# Patient Record
Sex: Female | Born: 1990
Health system: Southern US, Community
[De-identification: ages and names within clinical notes are randomized; demographics above are authoritative.]

## PROBLEM LIST (undated history)

## (undated) HISTORY — PX: TYMPANOSTOMY TUBE PLACEMENT: SHX32

---

## 2017-03-28 ENCOUNTER — Encounter: Payer: Self-pay | Admitting: Nurse Practitioner

## 2017-03-28 ENCOUNTER — Other Ambulatory Visit (INDEPENDENT_AMBULATORY_CARE_PROVIDER_SITE_OTHER): Payer: BLUE CROSS/BLUE SHIELD

## 2017-03-28 ENCOUNTER — Ambulatory Visit (INDEPENDENT_AMBULATORY_CARE_PROVIDER_SITE_OTHER): Payer: BLUE CROSS/BLUE SHIELD | Admitting: Nurse Practitioner

## 2017-03-28 ENCOUNTER — Ambulatory Visit (INDEPENDENT_AMBULATORY_CARE_PROVIDER_SITE_OTHER)
Admission: RE | Admit: 2017-03-28 | Discharge: 2017-03-28 | Disposition: A | Discharge status: Home / Self Care | Payer: BLUE CROSS/BLUE SHIELD | Source: Ambulatory Visit | Attending: Nurse Practitioner | Admitting: Nurse Practitioner

## 2017-03-28 VITALS — BP 130/92 | HR 59 | Temp 97.7°F | Ht 66.0 in | Wt 149.0 lb

## 2017-03-28 DIAGNOSIS — Z0000 Encounter for general adult medical examination without abnormal findings: Secondary | ICD-10-CM

## 2017-03-28 DIAGNOSIS — R590 Localized enlarged lymph nodes: Secondary | ICD-10-CM

## 2017-03-28 DIAGNOSIS — Z803 Family history of malignant neoplasm of breast: Secondary | ICD-10-CM | POA: Insufficient documentation

## 2017-03-28 DIAGNOSIS — R599 Enlarged lymph nodes, unspecified: Secondary | ICD-10-CM | POA: Diagnosis not present

## 2017-03-28 DIAGNOSIS — Z8349 Family history of other endocrine, nutritional and metabolic diseases: Secondary | ICD-10-CM | POA: Insufficient documentation

## 2017-03-28 DIAGNOSIS — Z800 Family history of malignant neoplasm of digestive organs: Secondary | ICD-10-CM | POA: Insufficient documentation

## 2017-03-28 DIAGNOSIS — Z111 Encounter for screening for respiratory tuberculosis: Secondary | ICD-10-CM

## 2017-03-28 LAB — CBC WITH DIFFERENTIAL/PLATELET
BASOS PCT: 1.7 % (ref 0.0–3.0)
Basophils Absolute: 0.1 10*3/uL (ref 0.0–0.1)
EOS PCT: 1.3 % (ref 0.0–5.0)
Eosinophils Absolute: 0.1 10*3/uL (ref 0.0–0.7)
HCT: 43 % (ref 36.0–46.0)
Hemoglobin: 14.5 g/dL (ref 12.0–15.0)
LYMPHS ABS: 2.2 10*3/uL (ref 0.7–4.0)
Lymphocytes Relative: 44.2 % (ref 12.0–46.0)
MCHC: 33.7 g/dL (ref 30.0–36.0)
MCV: 91.7 fl (ref 78.0–100.0)
MONOS PCT: 5.9 % (ref 3.0–12.0)
Monocytes Absolute: 0.3 10*3/uL (ref 0.1–1.0)
NEUTROS ABS: 2.3 10*3/uL (ref 1.4–7.7)
NEUTROS PCT: 46.9 % (ref 43.0–77.0)
PLATELETS: 276 10*3/uL (ref 150.0–400.0)
RBC: 4.68 Mil/uL (ref 3.87–5.11)
RDW: 13.6 % (ref 11.5–15.5)
WBC: 5 10*3/uL (ref 4.0–10.5)

## 2017-03-28 LAB — COMPREHENSIVE METABOLIC PANEL
ALT: 17 U/L (ref 0–35)
AST: 18 U/L (ref 0–37)
Albumin: 4.9 g/dL (ref 3.5–5.2)
Alkaline Phosphatase: 31 U/L — ABNORMAL LOW (ref 39–117)
BILIRUBIN TOTAL: 0.5 mg/dL (ref 0.2–1.2)
BUN: 17 mg/dL (ref 6–23)
CALCIUM: 9.9 mg/dL (ref 8.4–10.5)
CHLORIDE: 105 meq/L (ref 96–112)
CO2: 27 meq/L (ref 19–32)
Creatinine, Ser: 0.96 mg/dL (ref 0.40–1.20)
GFR: 74.89 mL/min (ref >60.00)
Glucose, Bld: 94 mg/dL (ref 70–99)
Potassium: 4.3 mEq/L (ref 3.5–5.1)
Sodium: 138 mEq/L (ref 135–145)
Total Protein: 7.8 g/dL (ref 6.0–8.3)

## 2017-03-28 LAB — TSH: TSH: 2.28 u[IU]/mL (ref 0.35–4.50)

## 2017-03-28 NOTE — Progress Notes (Signed)
Subjective:    Patient ID: Deirdre Peer, female    DOB: 1991/05/14, 26 y.o.   MRN: [id number]  Patient presents today for establish care (new patient).  HPI  Immunizations: (TDAP, Hep C screen, Pneumovax, Influenza, zoster)  Health Maintenance  Topic Date Due  . HIV Screening  08/10/2006  . Tetanus Vaccine  08/10/2010  . Pap Smear  08/10/2012  . Flu Shot  07/26/2017   Diet:healthy Weight:  Wt Readings from Last 3 Encounters:  03/28/17 149 lb (67.6 kg)    Exercise:walking and gym Fall Risk: Fall Risk  03/28/2017  Falls in the past year? No   Home Safety:home with female partner Depression/Suicide: Depression screen [facility] 2/9 03/28/2017  Decreased Interest 0  Down, Depressed, Hopeless 0  PHQ - 2 Score 0   No flowsheet data found. Pap Smear (every 59yr for >21-29 without HPV, every 56yrfor >30-6519yrith HPV):has GYN, irregular menstrual cycle,  Mammogram (yearly, >45y12yrot yet Sexual History (birth control, marital status, STD):sexually active with female  Medications and allergies reviewed with patient and updated if appropriate.  Patient Active Problem List   Diagnosis Date Noted  . LAD (lymphadenopathy), posterior cervical 03/28/2017  . Family history of breast cancer 03/28/2017  . FH: colon cancer in relative diagnosed at >50 y45rs old 03/28/2017  . FHx: early menopause 03/28/2017    No current outpatient prescriptions on file prior to visit.   No current facility-administered medications on file prior to visit.     History reviewed. No pertinent past medical history.  Past Surgical History:  Procedure Laterality Date  . TYMPANOSTOMY TUBE PLACEMENT Bilateral     Social History   Social History  . Marital status: Unknown    Spouse name: N/A  . Number of children: N/A  . Years of education: N/A   Social History Main Topics  . Smoking status: Never Smoker  . Smokeless tobacco: Never Used  . Alcohol use 1.2 oz/week    2 Cans of beer per week       Comment: a week  . Drug use: No  . Sexual activity: Yes   Other Topics Concern  . None   Social History Narrative  . None    Family History  Problem Relation Age of Onset  . Arthritis Mother   . Fibromyalgia Mother   . Early menopause Mother 39  23Cancer Father     prostate cancer  . Cancer Maternal Grandmother 45    breast cancer with brain Met  . Early menopause Maternal Grandmother 39  55Cancer Maternal Aunt 50    colon cancer        ROS  Objective:   Vitals:   03/28/17 1129  BP: (!) 130/92  Pulse: (!) 59  Temp: 97.7 F (36.5 C)    Body mass index is 24.05 kg/m.   Physical Examination:  Physical Exam  Constitutional: She is oriented to person, place, and time and well-developed, well-nourished, and in no distress. No distress.  HENT:  Right Ear: External ear normal.  Left Ear: External ear normal.  Nose: Nose normal.  Mouth/Throat: Oropharynx is clear and moist. No oropharyngeal exudate.  Eyes: Conjunctivae and EOM are normal. Pupils are equal, round, and reactive to light. No scleral icterus.  Neck: Normal range of motion. Neck supple. No thyroid mass and no thyromegaly present.    Cardiovascular: Normal rate, normal heart sounds and intact distal pulses.   Pulmonary/Chest: Effort normal and breath sounds normal. She exhibits no  tenderness.  Abdominal: Soft. Bowel sounds are normal. She exhibits no distension. There is no tenderness.  Musculoskeletal: Normal range of motion. She exhibits no edema or tenderness.  Lymphadenopathy:    She has cervical adenopathy.  Neurological: She is alert and oriented to person, place, and time. Gait normal.  Skin: Skin is warm and dry. No rash noted. No erythema.  Psychiatric: Affect and judgment normal.    ASSESSMENT and PLAN:  Kaelene was seen today for establish care.  Diagnoses and all orders for this visit:  Encounter for medical examination to establish care -     CBC w/Diff; Future -      TSH; Future -     HIV antibody; Future -     Comprehensive metabolic panel; Future -     DG Chest 2 View; Future  LAD (lymphadenopathy), posterior cervical -     CBC w/Diff; Future -     TSH; Future -     HIV antibody; Future -     Comprehensive metabolic panel; Future -     DG Chest 2 View; Future  Screening for tuberculosis -     PPD   No problem-specific Assessment & Plan notes found for this encounter.      Follow up: Return if symptoms worsen or fail to improve.  Wilfred Lacy, NP

## 2017-03-28 NOTE — Progress Notes (Signed)
Normal results, see office note

## 2017-03-28 NOTE — Progress Notes (Signed)
Pre visit review using our clinic review tool, if applicable. No additional management support is needed unless otherwise documented below in the visit note. 

## 2017-03-28 NOTE — Patient Instructions (Addendum)
Go to lab for blood draw and CXR.   Lymphangitis, Adult Lymphangitis is inflammation of one or more lymph vessels. This condition is usually caused by a bacterial infection. The lymphatic system is part of the body's defense system (immune system). It is a network of vessels, glands, and organs that carry fluid (lymph) and other substances around the body. Lymph vessels drain into glands called lymph nodes. These nodes filter bacteria and waste products from lymph. Lymphangitis usually develops when bacteria spreads to the lymphatic system from an infected wound or scrape on the skin of the arms or legs. It can also result from a skin infection (cellulitis) that spreads to the lymph nodes. Lymphangitis can spread quickly through your lymph system and into your blood (bacteremia). What are the causes? This condition is usually caused by bacteria that get into the lymph vessels. Most often, this is streptococcus bacteria. In some cases, staphylococcus bacteria may be the cause. Many other types of bacteria can also cause lymphangitis, but that is rare. What increases the risk? The following factors may make you more likely to develop this condition:  Being female. Men are more likely to get lymphangitis caused by cellulitis.  Having a decreased ability to fight infection or a weakened immune system.  Having diabetes.  Taking drugs that suppress the immune system.  Having chickenpox.  Being weak from another illness. What are the signs or symptoms? The most common symptom of lymphangitis is a wound or skin infection that develops red streaks in the skin. These are the infected lymph vessels. The red streaks will extend toward the lymph nodes that drain the vessels. Other symptoms may include:  Warmth and tenderness over the streaks.  Throbbing pain.  Swollen and tender lymph nodes.  For arm infections, these will be under the arm.  For leg infections, these will be in the groin  area.  Fever.  Chills.  Headache.  Appetite loss.  Muscle aches.  Fast pulse. How is this diagnosed? This condition may be diagnosed based on your symptoms and a physical exam. You may also have tests, such as:  Blood tests to check for an increase in white blood cells.  Blood cultures to look for bacteremia.  Culture and sensitivity testing. This is a test to find out what type of bacteria will grow from a sample of pus swabbed from the wound or skin infection. The results help determine which antibiotic medicines will kill the bacteria. How is this treated? Treatment for this condition may include:  Antibiotics.  You may be started on an antibiotic that is known to kill both streptococcus and staphylococcus bacteria.  Your antibiotics may need to be switched if tests show that your condition is caused by another type of bacteria.  If your infection is very bad or has spread to another area of your body, you may need to get antibiotics through an IV at the hospital.  Pain medicine.  Incision and drainage. This is a procedure that may be done at the hospital if pus needs to be drained from your wound. Follow these instructions at home:  Take over-the-counter and prescription medicines only as told by your health care provider.  Take your antibiotic medicine as told by your health care provider. Do not stop taking the antibiotic even if you start to feel better.  Rest at home until your health care provider says that you can return to your normal activities.  Follow instructions from your health care provider about how  to take care of your wound.  Raise (elevate) the affected area above the level of your heart while you are sitting or lying down.  Keep all follow-up visits as told by your health care provider. This is important. Contact a health care provider if:  You have chills or a fever.  Your symptoms do not go away with treatment.  Your symptoms come back  after treatment. This information is not intended to replace advice given to you by your health care provider. Make sure you discuss any questions you have with your health care provider. Document Released: 01/08/2016 Document Revised: 05/19/2016 Document Reviewed: 12/31/2014 Elsevier Interactive Patient Education  2017 [employer].

## 2017-03-29 ENCOUNTER — Telehealth: Payer: Self-pay | Admitting: Nurse Practitioner

## 2017-03-29 LAB — HIV ANTIBODY (ROUTINE TESTING W REFLEX): HIV 1&2 Ab, 4th Generation: NONREACTIVE

## 2017-03-29 NOTE — Telephone Encounter (Signed)
Pt states she missed a call but there are no notes please call back.

## 2017-03-29 NOTE — Telephone Encounter (Signed)
Left vm for pt to call back if she has any questions, but I havent call pt, still waiting for test result.

## 2017-03-29 NOTE — Progress Notes (Signed)
Normal results, see office note

## 2017-03-30 NOTE — Telephone Encounter (Signed)
Pt verbalize understand of test result.   

## 2017-03-31 LAB — TB SKIN TEST
INDURATION: 0 mm
TB SKIN TEST: NEGATIVE

## 2017-04-25 ENCOUNTER — Ambulatory Visit (INDEPENDENT_AMBULATORY_CARE_PROVIDER_SITE_OTHER): Payer: BLUE CROSS/BLUE SHIELD | Admitting: Nurse Practitioner

## 2017-04-25 VITALS — BP 110/78 | HR 70 | Temp 97.9°F | Ht 66.0 in | Wt 148.0 lb

## 2017-04-25 DIAGNOSIS — Z8349 Family history of other endocrine, nutritional and metabolic diseases: Secondary | ICD-10-CM

## 2017-04-25 DIAGNOSIS — G4700 Insomnia, unspecified: Secondary | ICD-10-CM | POA: Diagnosis not present

## 2017-04-25 DIAGNOSIS — N926 Irregular menstruation, unspecified: Secondary | ICD-10-CM | POA: Diagnosis not present

## 2017-04-25 DIAGNOSIS — J0140 Acute pansinusitis, unspecified: Secondary | ICD-10-CM | POA: Diagnosis not present

## 2017-04-25 DIAGNOSIS — R232 Flushing: Secondary | ICD-10-CM | POA: Diagnosis not present

## 2017-04-25 MED ORDER — SALINE SPRAY 0.65 % NA SOLN: 1.0000 | NASAL | 0 refills | Status: DC | PRN

## 2017-04-25 MED ORDER — FLUTICASONE PROPIONATE 50 MCG/ACT NA SUSP: 2.0000 | Freq: Every day | NASAL | 0 refills | Status: DC

## 2017-04-25 MED ORDER — AZITHROMYCIN 250 MG PO TABS: 250.0000 mg | ORAL_TABLET | Freq: Every day | ORAL | 0 refills | Status: DC

## 2017-04-25 MED ORDER — OXYMETAZOLINE HCL 0.05 % NA SOLN: 1.0000 | Freq: Two times a day (BID) | NASAL | 0 refills | Status: DC

## 2017-04-25 MED ORDER — METHYLPREDNISOLONE ACETATE 40 MG/ML IJ SUSP
40.0000 mg | Freq: Once | INTRAMUSCULAR | Status: AC
  Administered 2017-04-25: 40 mg via INTRAMUSCULAR

## 2017-04-25 MED ORDER — GUAIFENESIN ER 600 MG PO TB12: 600.0000 mg | ORAL_TABLET | Freq: Two times a day (BID) | ORAL | 0 refills | Status: DC | PRN

## 2017-04-25 MED FILL — FLUTICASONE PROP 50 MCG SPR: 50 | 30 days supply | Qty: 16 | Fill #0

## 2017-04-25 MED FILL — AZITHROMYCIN 250 MG TAB: 250 | 5 days supply | Qty: 6 | Fill #0

## 2017-04-25 NOTE — Patient Instructions (Signed)
[facility] Instructions: Flonase and Afrin use: apply 1spray of afrin in each nare, wait , then apply 2sprays of flonase in each nare. Use both nasal spray consecutively x 3days, then flonase only for at least 14days.  Encourage adequate oral hydration.  Use over-the-counter  "cold" medicines  such as "Tylenol cold" , "Advil cold",  "Mucinex" or" Mucinex D"  for cough and congestion.  Avoid decongestants if you have high blood pressure. Use" Delsym" or" Robitussin" cough syrup varietis for cough.  You can use plain "Tylenol" or "Advi"l for fever, chills and achyness.   "Common cold" symptoms are usually triggered by a virus.  The antibiotics are usually not necessary. On average, a" viral cold" illness would take 4-7 days to resolve. Please, make an appointment if you are not better or if you're worse.   You will be contacted to schedule ultrasound. Return to lab for blood draw in 2weeks.

## 2017-04-25 NOTE — Progress Notes (Signed)
Subjective:  Patient ID: Melanie Keller, female    DOB: February 09, 1991  Age: 26 y.o. MRN: [id number]  CC: Nasal Congestion (sore throat since friday, dayquil helps cant sleep at night. had bad epistaxis at noon went for ten minutes and finished with a clot and then stopped)   Sinusitis  This is a new problem. The current episode started in the past 7 days. The problem has been rapidly worsening since onset. There has been no fever. The pain is severe. Associated symptoms include chills, congestion, diaphoresis, ear pain, a hoarse voice, sinus pressure, a sore throat and swollen glands. Pertinent negatives include no coughing or shortness of breath. Past treatments include oral decongestants. The treatment provided mild relief.   Hot Flashes: She complains of persistent and worsening hot flashes associated with interrupted sleep and irregular menstrual cycle. Reports FH of premature ovarian failure (mother and aunt).  No outpatient prescriptions prior to visit.   No facility-administered medications prior to visit.     ROS See HPI  Objective:  BP 110/78 (BP Location: Left Arm, Patient Position: Sitting, Cuff Size: Normal)   Pulse 70   Temp 97.9 F (36.6 C) (Oral)   Ht  (1.676 m)   Wt 148 lb (67.1 kg)   LMP 03/26/2017 (Approximate)   SpO2 99%   BMI 23.89 kg/m   BP Readings from Last 3 Encounters:  04/25/17 110/78  03/28/17 (!) 130/92    Wt Readings from Last 3 Encounters:  04/25/17 148 lb (67.1 kg)  03/28/17 149 lb (67.6 kg)    Physical Exam  Constitutional: She is oriented to person, place, and time.  HENT:  Right Ear: Tympanic membrane, external ear and ear canal normal.  Left Ear: Tympanic membrane, external ear and ear canal normal.  Nose: Mucosal edema and rhinorrhea present. Right sinus exhibits maxillary sinus tenderness and frontal sinus tenderness. Left sinus exhibits maxillary sinus tenderness and frontal sinus tenderness.  Mouth/Throat: Uvula is  midline. No trismus in the jaw. Posterior oropharyngeal erythema present. No oropharyngeal exudate.  Eyes: No scleral icterus.  Neck: Normal range of motion. Neck supple. No thyromegaly present.  Cardiovascular: Normal rate and normal heart sounds.   Pulmonary/Chest: Effort normal and breath sounds normal.  Musculoskeletal: She exhibits no edema.  Lymphadenopathy:    She has cervical adenopathy.  Neurological: She is alert and oriented to person, place, and time.  Vitals reviewed.   Lab Results  Component Value Date   WBC 5.0 03/28/2017   HGB 14.5 03/28/2017   HCT 43.0 03/28/2017   PLT 276.0 03/28/2017   GLUCOSE 94 03/28/2017   ALT 17 03/28/2017   AST 18 03/28/2017   NA 138 03/28/2017   K 4.3 03/28/2017   CL 105 03/28/2017   CREATININE 0.96 03/28/2017   BUN 17 03/28/2017   CO2 27 03/28/2017   TSH 2.28 03/28/2017    Dg Chest 2 View  Result Date: 03/28/2017 CLINICAL DATA:  Posterior cervical lymphadenopathy EXAM: CHEST  2 VIEW COMPARISON:  None. FINDINGS: No active infiltrate or effusion is seen. Mediastinal and hilar contours are unremarkable. There is no evidence of adenopathy. The heart is within normal limits in size. No bony abnormality is seen. IMPRESSION: No active cardiopulmonary disease. Electronically Signed   By: Dwyane Dee M.D.   On: 03/28/2017 12:37    Assessment & Plan:   Azayla was seen today for nasal congestion.  Diagnoses and all orders for this visit:  Acute non-recurrent pansinusitis -     methylPREDNISolone  acetate (DEPO-MEDROL) injection 40 mg; Inject 1 mL (40 mg total) into the muscle once. -     sodium chloride (OCEAN) 0.65 % SOLN nasal spray; Place 1 spray into both nostrils as needed for congestion. -     guaiFENesin (MUCINEX) 600 MG 12 hr tablet; Take 1 tablet (600 mg total) by mouth 2 (two) times daily as needed for cough or to loosen phlegm. -     fluticasone (FLONASE) 50 MCG/ACT nasal spray; Place 2 sprays into both nostrils daily. -      oxymetazoline (AFRIN NASAL SPRAY) 0.05 % nasal spray; Place 1 spray into both nostrils 2 (two) times daily. Use only for 3days, then stop -     azithromycin (ZITHROMAX Z-PAK) 250 MG tablet; Take 1 tablet (250 mg total) by mouth daily. Take 2tabs on first day, then 1tab once a day till complete  Hot flashes -     FSH; Future -     Estradiol; Future -     Progesterone; Future -     PTH, intact and calcium; Future -     US Pelvis Complete; Future -     US Transvaginal Non-OB; Future  Insomnia, unspecified type -     FSH; Future -     Estradiol; Future -     Progesterone; Future -     PTH, intact and calcium; Future -     US Pelvis Complete; Future -     US Transvaginal Non-OB; Future  FHx: early menopause -     FSH; Future -     Estradiol; Future -     Progesterone; Future -     PTH, intact and calcium; Future -     US Pelvis Complete; Future -     US Transvaginal Non-OB; Future  Irregular menstrual cycle -     FSH; Future -     Estradiol; Future -     Progesterone; Future -     PTH, intact and calcium; Future -     US Pelvis Complete; Future -     US Transvaginal Non-OB; Future   I am having Melanie Keller start on sodium chloride, guaiFENesin, fluticasone, oxymetazoline, and azithromycin. We administered methylPREDNISolone acetate.  Meds ordered this encounter  Medications  . methylPREDNISolone acetate (DEPO-MEDROL) injection 40 mg  . sodium chloride (OCEAN) 0.65 % SOLN nasal spray    Sig: Place 1 spray into both nostrils as needed for congestion.    Dispense:  15 mL    Refill:  0    Order Specific Question:   Supervising Provider    Answer:   Tresa Garter [[id number]]  . guaiFENesin (MUCINEX) 600 MG 12 hr tablet    Sig: Take 1 tablet (600 mg total) by mouth 2 (two) times daily as needed for cough or to loosen phlegm.    Dispense:  14 tablet    Refill:  0    Order Specific Question:   Supervising Provider    Answer:   Tresa Garter [[id number]]  . fluticasone  (FLONASE) 50 MCG/ACT nasal spray    Sig: Place 2 sprays into both nostrils daily.    Dispense:  16 g    Refill:  0    Order Specific Question:   Supervising Provider    Answer:   Tresa Garter [[id number]]  . oxymetazoline (AFRIN NASAL SPRAY) 0.05 % nasal spray    Sig: Place 1 spray into both nostrils 2 (two) times daily. Use only for 3days,  then stop    Dispense:  30 mL    Refill:  0    Order Specific Question:   Supervising Provider    Answer:   Tresa Garter [[id number]]  . azithromycin (ZITHROMAX Z-PAK) 250 MG tablet    Sig: Take 1 tablet (250 mg total) by mouth daily. Take 2tabs on first day, then 1tab once a day till complete    Dispense:  6 tablet    Refill:  0    Order Specific Question:   Supervising Provider    Answer:   Tresa Garter [[id number]]    Follow-up: Return if symptoms worsen or fail to improve.  Alysia Penna, NP

## 2017-04-25 NOTE — Progress Notes (Signed)
Pre visit review using our clinic review tool, if applicable. No additional management support is needed unless otherwise documented below in the visit note. 

## 2017-05-12 ENCOUNTER — Ambulatory Visit
Admission: RE | Admit: 2017-05-12 | Discharge: 2017-05-12 | Disposition: A | Discharge status: Home / Self Care | Payer: BLUE CROSS/BLUE SHIELD | Source: Ambulatory Visit | Attending: Nurse Practitioner | Admitting: Nurse Practitioner

## 2017-05-12 DIAGNOSIS — R188 Other ascites: Secondary | ICD-10-CM | POA: Diagnosis not present

## 2017-05-12 DIAGNOSIS — Z8349 Family history of other endocrine, nutritional and metabolic diseases: Secondary | ICD-10-CM

## 2017-05-12 DIAGNOSIS — R232 Flushing: Secondary | ICD-10-CM

## 2017-05-12 DIAGNOSIS — G4700 Insomnia, unspecified: Secondary | ICD-10-CM

## 2017-05-12 DIAGNOSIS — N926 Irregular menstruation, unspecified: Secondary | ICD-10-CM

## 2017-05-15 ENCOUNTER — Other Ambulatory Visit (INDEPENDENT_AMBULATORY_CARE_PROVIDER_SITE_OTHER): Payer: BLUE CROSS/BLUE SHIELD

## 2017-05-15 DIAGNOSIS — N926 Irregular menstruation, unspecified: Secondary | ICD-10-CM | POA: Diagnosis not present

## 2017-05-15 DIAGNOSIS — Z8349 Family history of other endocrine, nutritional and metabolic diseases: Secondary | ICD-10-CM | POA: Diagnosis not present

## 2017-05-15 DIAGNOSIS — R232 Flushing: Secondary | ICD-10-CM | POA: Diagnosis not present

## 2017-05-15 DIAGNOSIS — G4700 Insomnia, unspecified: Secondary | ICD-10-CM | POA: Diagnosis not present

## 2017-05-15 LAB — FOLLICLE STIMULATING HORMONE: FSH: 122.9 m[IU]/mL

## 2017-05-16 ENCOUNTER — Encounter: Payer: Self-pay | Admitting: Nurse Practitioner

## 2017-05-16 LAB — PROGESTERONE: Progesterone: <0.5 ng/mL

## 2017-05-16 LAB — PTH, INTACT AND CALCIUM
CALCIUM: 9.8 mg/dL (ref 8.6–10.2)
PTH: 39 pg/mL (ref 14–64)

## 2017-05-16 LAB — ESTRADIOL: Estradiol: 18 pg/mL

## 2017-05-16 NOTE — Telephone Encounter (Signed)
Apologized to patient for conflicting result note. Informed patient about result note correction. patient states she is not concerned about fertility at this time.  She will like referral to endocrinology for possible estrogen and progesterone replacement due to risk of osteoporosis and cardiovascular disease. I advised patient to inform endocrinology about FH of breast and colon cancer at <50. She verbalized understanding.

## 2017-05-26 ENCOUNTER — Encounter: Payer: Self-pay | Admitting: Nurse Practitioner

## 2017-05-26 DIAGNOSIS — E288 Other ovarian dysfunction: Principal | ICD-10-CM

## 2017-05-26 DIAGNOSIS — E2839 Other primary ovarian failure: Secondary | ICD-10-CM

## 2017-07-13 ENCOUNTER — Encounter: Payer: Self-pay | Admitting: Endocrinology

## 2017-07-13 ENCOUNTER — Other Ambulatory Visit: Payer: Self-pay | Admitting: Nurse Practitioner

## 2017-07-13 ENCOUNTER — Ambulatory Visit: Payer: BLUE CROSS/BLUE SHIELD | Admitting: Endocrinology

## 2017-07-13 ENCOUNTER — Encounter: Payer: Self-pay | Admitting: Nurse Practitioner

## 2017-07-13 DIAGNOSIS — N911 Secondary amenorrhea: Secondary | ICD-10-CM

## 2017-07-13 DIAGNOSIS — E288 Other ovarian dysfunction: Principal | ICD-10-CM

## 2017-07-13 DIAGNOSIS — E2839 Other primary ovarian failure: Secondary | ICD-10-CM

## 2017-07-13 MED ORDER — NORGESTIM-ETH ESTRAD TRIPHASIC 0.18/0.215/0.25 MG-35 MCG PO TABS: 1.0000 | ORAL_TABLET | Freq: Every day | ORAL | 11 refills | Status: DC

## 2017-07-13 NOTE — Patient Instructions (Signed)
I have sent a prescription to your pharmacy, for birth control pills. We can check the vitamin B-12 the next time you are having blood tests taken. Please return in 1 year.

## 2017-07-13 NOTE — Progress Notes (Signed)
Subjective:    Patient ID: Melanie Keller, female    DOB: Jan 10, 1991, 26 y.o.   MRN: [id number]  HPI Pt is ref by Wilfred Lacy, NP, for POF.  She reports menarche at age 81. She is G0.  Menses became less frequent a few years ago, then stopped 1 year ago.  She has slight dryness of the vagina, and assoc intermitt spotting.   No past medical history on file.  Past Surgical History:  Procedure Laterality Date  . TYMPANOSTOMY TUBE PLACEMENT Bilateral     Social History   Social History  . Marital status: Unknown    Spouse name: N/A  . Number of children: N/A  . Years of education: N/A   Occupational History  . Not on file.   Social History Main Topics  . Smoking status: Never Smoker  . Smokeless tobacco: Never Used  . Alcohol use 1.2 oz/week    2 Cans of beer per week     Comment: a week  . Drug use: No  . Sexual activity: Yes   Other Topics Concern  . Not on file   Social History Narrative  . No narrative on file    Current Outpatient Prescriptions on File Prior to Visit  Medication Sig Dispense Refill  . fluticasone (FLONASE) 50 MCG/ACT nasal spray Place 2 sprays into both nostrils daily. (Patient not taking: Reported on 07/13/2017) 16 g 0  . guaiFENesin (MUCINEX) 600 MG 12 hr tablet Take 1 tablet (600 mg total) by mouth 2 (two) times daily as needed for cough or to loosen phlegm. (Patient not taking: Reported on 07/13/2017) 14 tablet 0  . oxymetazoline (AFRIN NASAL SPRAY) 0.05 % nasal spray Place 1 spray into both nostrils 2 (two) times daily. Use only for 3days, then stop (Patient not taking: Reported on 07/13/2017) 30 mL 0  . sodium chloride (OCEAN) 0.65 % SOLN nasal spray Place 1 spray into both nostrils as needed for congestion. (Patient not taking: Reported on 07/13/2017) 15 mL 0   No current facility-administered medications on file prior to visit.     No Known Allergies  Family History  Problem Relation Age of Onset  . Arthritis Mother   . Fibromyalgia  Mother   . Early menopause Mother 47  . Cancer Father        prostate cancer  . Cancer Maternal Grandmother 45       breast cancer with brain Met  . Early menopause Maternal Grandmother 71  . Cancer Maternal Aunt 50       colon cancer    BP 116/76   Pulse (!) 54   Wt 148 lb 3.2 oz (67.2 kg)   SpO2 98%   BMI 23.92 kg/m     Review of Systems  Constitutional:       She has lost af ew lbs, due to her efforts  HENT: Negative for hearing loss.   Eyes: Negative for visual disturbance.  Respiratory: Negative for shortness of breath.   Cardiovascular: Negative for chest pain.  Gastrointestinal: Negative for nausea.  Endocrine: Negative for cold intolerance.  Genitourinary: Negative for hematuria.  Musculoskeletal: Positive for arthralgias.  Skin: Negative for rash.  Allergic/Immunologic: Negative for environmental allergies.  Neurological: Negative for numbness.  Hematological: Bruises/bleeds easily.  Psychiatric/Behavioral: Negative for dysphoric mood.   "hot flashes" have resolved.  Denies diarrhea    Objective:   Physical Exam VS: see vs page GEN: no distress HEAD: head: no deformity eyes: no periorbital swelling, no  proptosis external nose and ears are normal mouth: no lesion seen NECK: supple, thyroid is not enlarged CHEST WALL: no deformity LUNGS:  Clear to auscultation CV: reg rate and rhythm, no murmur ABD: abdomen is soft, nontender.  no hepatosplenomegaly.  not distended.  no hernia MUSCULOSKELETAL: muscle bulk and strength are grossly normal.  no obvious joint swelling.  gait is normal and steady EXTEMITIES: no edema PULSES: no carotid bruit NEURO:  cn 2-12 grossly intact.   readily moves all 4's.  sensation is intact to touch on all 4's SKIN:  Normal texture and temperature.  No rash or suspicious lesion is visible.  No terminal hair on the face.  No vitiligo.   NODES:  None palpable at the neck PSYCH: alert, well-oriented.  Does not appear anxious nor  depressed.  Lab Results  Component Value Date   CREATININE 0.96 03/28/2017   BUN 17 03/28/2017   NA 138 03/28/2017   K 4.3 03/28/2017   CL 105 03/28/2017   CO2 27 03/28/2017   Lab Results  Component Value Date   TSH 2.28 03/28/2017   Lab Results  Component Value Date   PTH 39 05/15/2017   CALCIUM 9.8 05/15/2017   Lab Results  Component Value Date   WBC 5.0 03/28/2017   HGB 14.5 03/28/2017   HCT 43.0 03/28/2017   MCV 91.7 03/28/2017   PLT 276.0 03/28/2017   FSH=123.   I have reviewed outside records, and summarized: Pt was noted to have secondary amenorrhea, and referred here.  She was seen for sinusitis, and was noted to have amenorrhea and hot flashes.     Assessment & Plan:  Premature ovarian failure, new.  No clinical evidence for other autoimmune disorders.    Patient Instructions  I have sent a prescription to your pharmacy, for birth control pills. We can check the vitamin B-12 the next time you are having blood tests taken. Please return in 1 year.

## 2017-07-15 ENCOUNTER — Encounter: Payer: Self-pay | Admitting: Endocrinology

## 2017-07-15 DIAGNOSIS — N911 Secondary amenorrhea: Secondary | ICD-10-CM | POA: Insufficient documentation

## 2017-08-31 IMAGING — US US PELVIS COMPLETE
1 series · 14 of 25 positions shown · non-contrast
Comparison: None

CLINICAL DATA: Initial evaluation for irregular menstrual cycles,
hot flashes, insomnia. Family history of premature ovarian failure.

EXAM:
TRANSABDOMINAL AND TRANSVAGINAL ULTRASOUND OF PELVIS
TECHNIQUE: Both transabdominal and transvaginal ultrasound examinations of the
pelvis were performed. Transabdominal technique was performed for
global imaging of the pelvis including uterus, ovaries, adnexal
regions, and pelvic cul-de-sac. It was necessary to proceed with
endovaginal exam following the transabdominal exam to visualize the
uterus and ovaries.

[Series 1: us pelvis complete · 0.19mm/px · 14 of 57 slices shown]
[im 1/57]
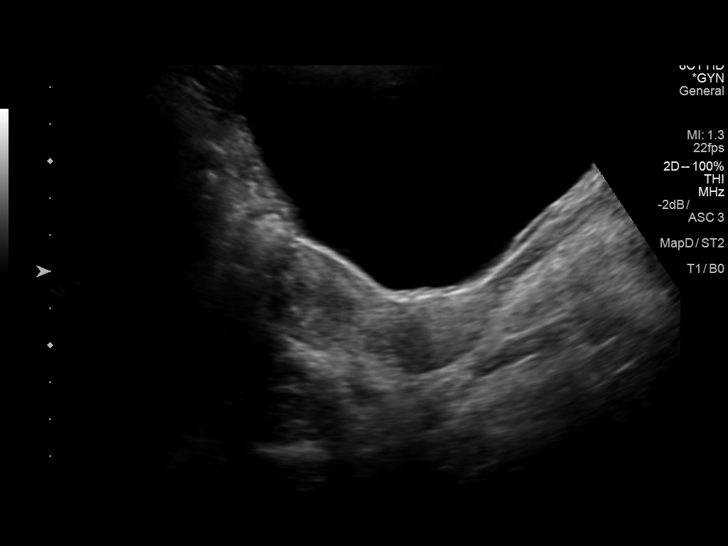
[im 5/57]
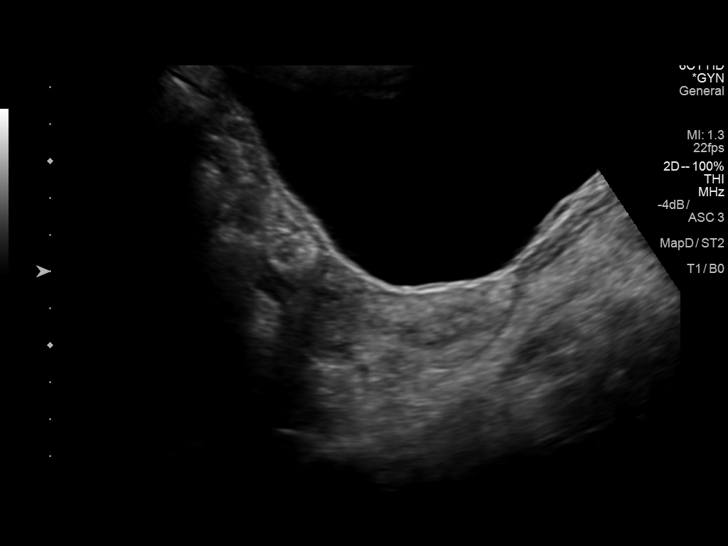
[im 10/57]
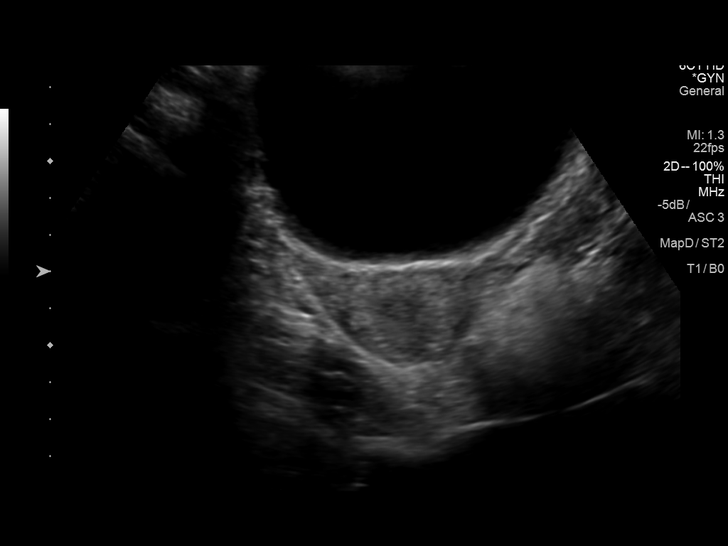
[im 15/57]
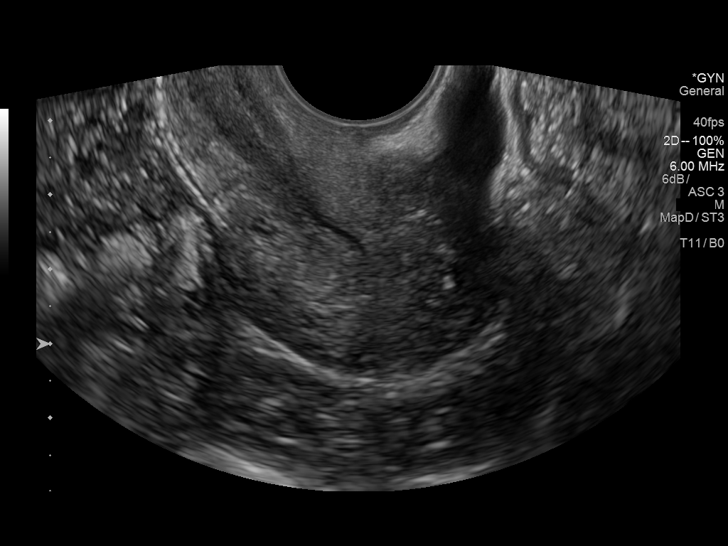
[im 19/57]
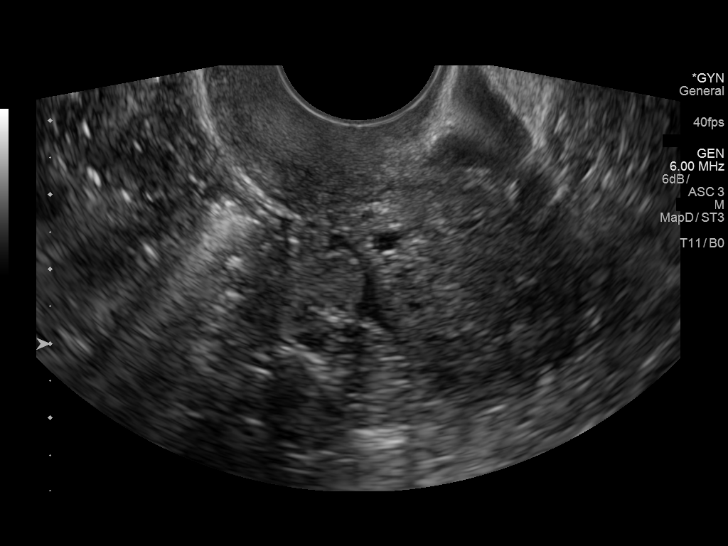
[im 22/57]
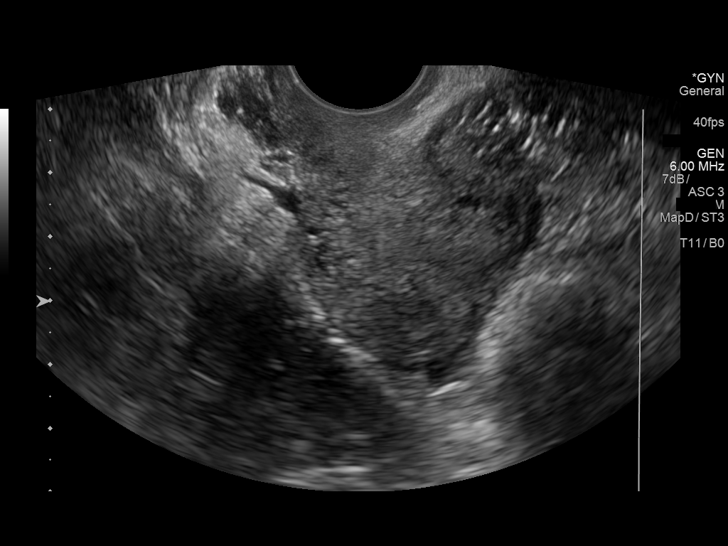
[im 26/57]
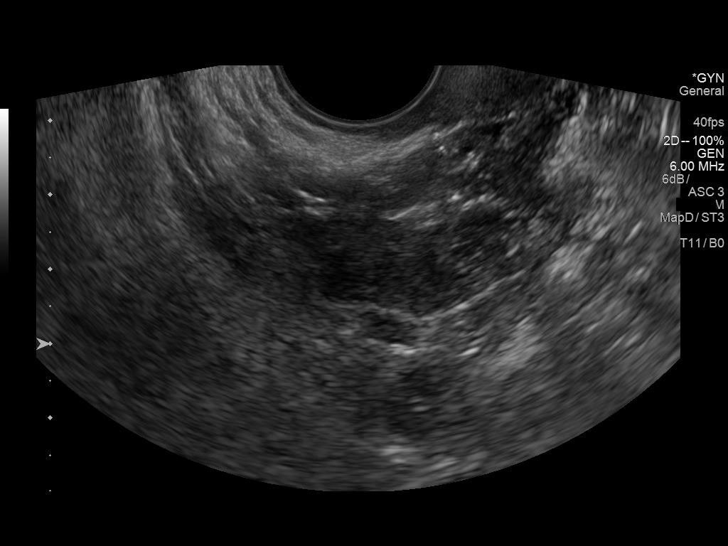
[im 31/57]
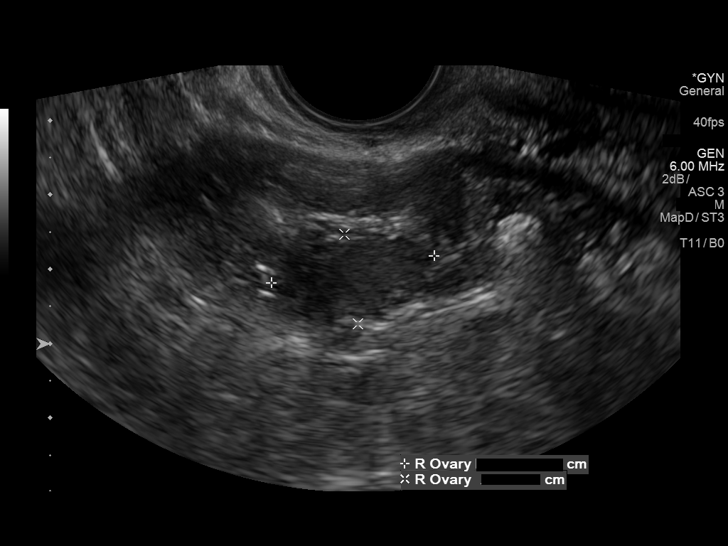
[im 36/57]
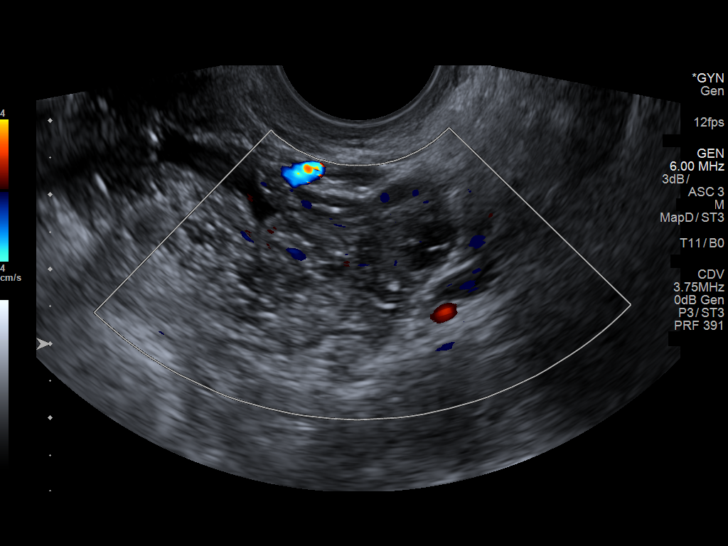
[im 38/57]
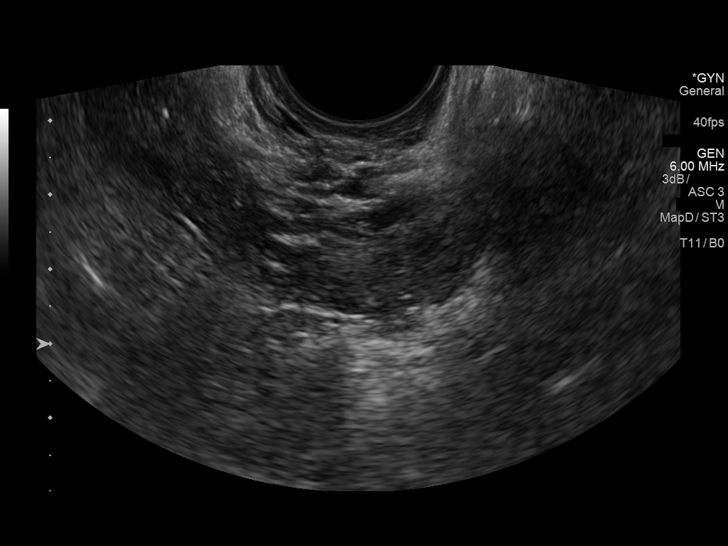
[im 43/57]
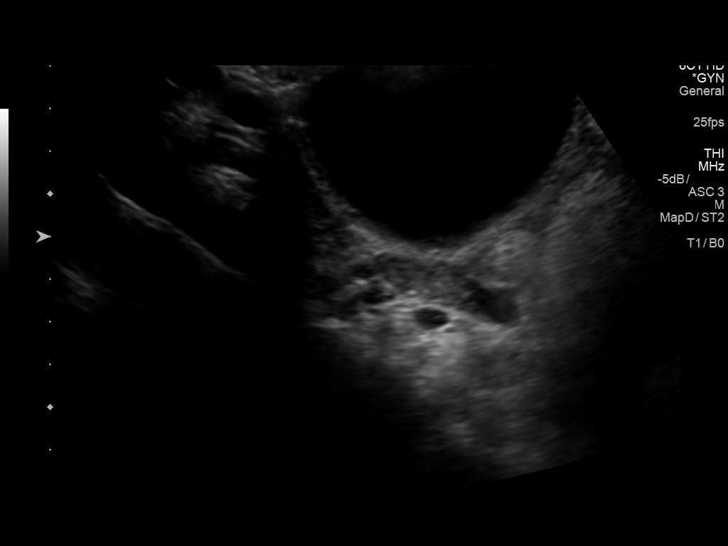
[im 47/57]
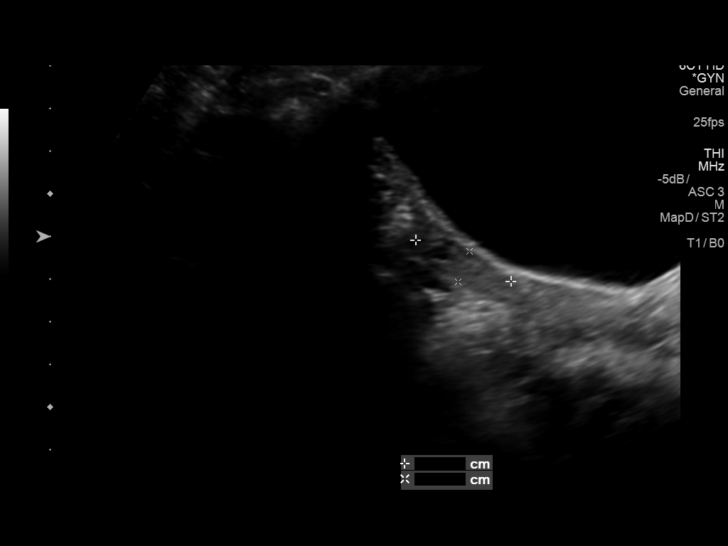
[im 52/57]
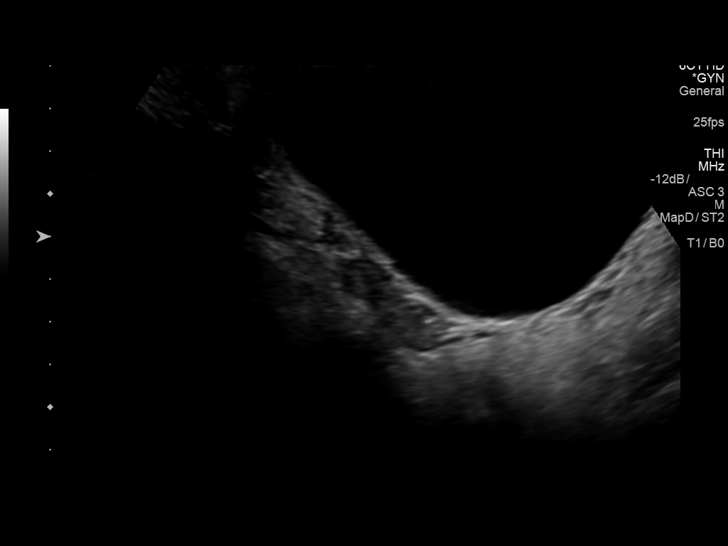
[im 57/57]
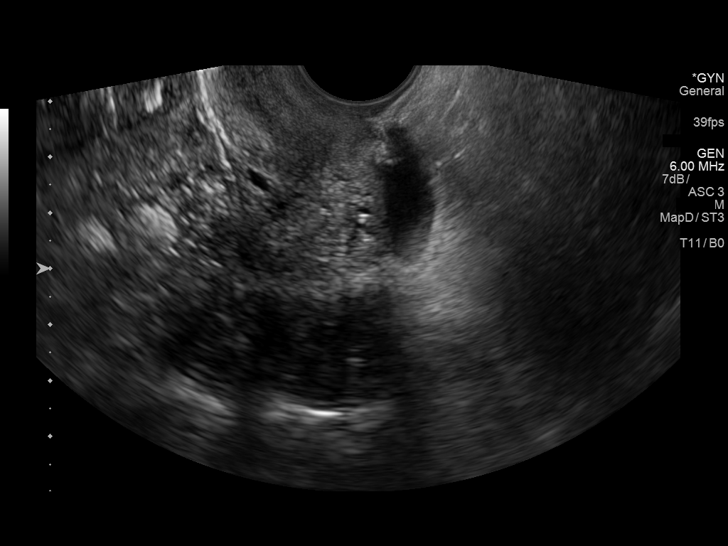

[14 of 25 positions shown; findings below may reference images not displayed]

FINDINGS: Uterus

Measurements: 5.9 x 2.4 x 4.2 cm. No fibroids or other mass
visualized.

Endometrium

Thickness: 4.7 mm.  No focal abnormality visualized.

Right ovary

Measurements: 2.2 x 1.2 x 2.4 cm. Normal appearance/no adnexal mass.

Left ovary

Measurements: 2.7 x 1.5 x 1.7 cm. Normal appearance/no adnexal mass.

Other findings

Small volume free fluid within the pelvis, most likely physiologic.
IMPRESSION: 1. Normal pelvic ultrasound.
2. Small volume free physiologic fluid within the pelvis.

## 2017-09-18 DIAGNOSIS — Z8349 Family history of other endocrine, nutritional and metabolic diseases: Secondary | ICD-10-CM | POA: Diagnosis not present

## 2017-09-18 DIAGNOSIS — E288 Other ovarian dysfunction: Secondary | ICD-10-CM | POA: Diagnosis not present

## 2017-09-18 DIAGNOSIS — Z9189 Other specified personal risk factors, not elsewhere classified: Secondary | ICD-10-CM | POA: Diagnosis not present

## 2017-09-18 DIAGNOSIS — Z803 Family history of malignant neoplasm of breast: Secondary | ICD-10-CM | POA: Diagnosis not present

## 2017-10-04 ENCOUNTER — Other Ambulatory Visit: Payer: Self-pay | Admitting: Internal Medicine

## 2017-10-04 DIAGNOSIS — E288 Other ovarian dysfunction: Principal | ICD-10-CM

## 2017-10-04 DIAGNOSIS — E2839 Other primary ovarian failure: Secondary | ICD-10-CM

## 2017-10-06 ENCOUNTER — Ambulatory Visit
Admission: RE | Admit: 2017-10-06 | Discharge: 2017-10-06 | Disposition: A | Discharge status: Home / Self Care | Payer: BLUE CROSS/BLUE SHIELD | Source: Ambulatory Visit | Attending: Internal Medicine | Admitting: Internal Medicine

## 2017-10-06 DIAGNOSIS — E288 Other ovarian dysfunction: Principal | ICD-10-CM

## 2017-10-06 DIAGNOSIS — Z780 Asymptomatic menopausal state: Secondary | ICD-10-CM | POA: Diagnosis not present

## 2017-10-06 DIAGNOSIS — Z13820 Encounter for screening for osteoporosis: Secondary | ICD-10-CM | POA: Diagnosis not present

## 2017-10-06 DIAGNOSIS — E2839 Other primary ovarian failure: Secondary | ICD-10-CM

## 2018-02-09 ENCOUNTER — Ambulatory Visit (INDEPENDENT_AMBULATORY_CARE_PROVIDER_SITE_OTHER): Payer: BLUE CROSS/BLUE SHIELD | Admitting: Psychology

## 2018-02-09 DIAGNOSIS — F411 Generalized anxiety disorder: Secondary | ICD-10-CM | POA: Diagnosis not present

## 2018-02-23 ENCOUNTER — Ambulatory Visit (INDEPENDENT_AMBULATORY_CARE_PROVIDER_SITE_OTHER): Payer: BLUE CROSS/BLUE SHIELD | Admitting: Psychology

## 2018-02-23 DIAGNOSIS — F411 Generalized anxiety disorder: Secondary | ICD-10-CM | POA: Diagnosis not present

## 2018-03-05 ENCOUNTER — Ambulatory Visit (INDEPENDENT_AMBULATORY_CARE_PROVIDER_SITE_OTHER): Payer: BLUE CROSS/BLUE SHIELD | Admitting: Psychology

## 2018-03-05 DIAGNOSIS — F411 Generalized anxiety disorder: Secondary | ICD-10-CM | POA: Diagnosis not present

## 2018-03-16 ENCOUNTER — Ambulatory Visit (INDEPENDENT_AMBULATORY_CARE_PROVIDER_SITE_OTHER): Payer: BLUE CROSS/BLUE SHIELD | Admitting: Psychology

## 2018-03-16 DIAGNOSIS — F419 Anxiety disorder, unspecified: Secondary | ICD-10-CM

## 2018-03-19 ENCOUNTER — Ambulatory Visit (INDEPENDENT_AMBULATORY_CARE_PROVIDER_SITE_OTHER): Payer: BLUE CROSS/BLUE SHIELD | Admitting: Psychology

## 2018-03-19 DIAGNOSIS — F411 Generalized anxiety disorder: Secondary | ICD-10-CM | POA: Diagnosis not present

## 2018-04-04 ENCOUNTER — Ambulatory Visit (INDEPENDENT_AMBULATORY_CARE_PROVIDER_SITE_OTHER): Payer: BLUE CROSS/BLUE SHIELD | Admitting: Psychology

## 2018-04-04 DIAGNOSIS — F411 Generalized anxiety disorder: Secondary | ICD-10-CM | POA: Diagnosis not present

## 2018-04-18 ENCOUNTER — Ambulatory Visit: Payer: BLUE CROSS/BLUE SHIELD | Admitting: Psychology

## 2018-04-25 ENCOUNTER — Emergency Department (HOSPITAL_COMMUNITY): Payer: No Typology Code available for payment source

## 2018-04-25 ENCOUNTER — Emergency Department (HOSPITAL_COMMUNITY)
Admission: EM | Admit: 2018-04-25 | Discharge: 2018-04-25 | Disposition: A | Discharge status: Home / Self Care | Payer: No Typology Code available for payment source | Attending: Emergency Medicine | Admitting: Emergency Medicine

## 2018-04-25 ENCOUNTER — Encounter (HOSPITAL_COMMUNITY): Payer: Self-pay | Admitting: *Deleted

## 2018-04-25 DIAGNOSIS — T592X1A Toxic effect of formaldehyde, accidental (unintentional), initial encounter: Secondary | ICD-10-CM | POA: Diagnosis not present

## 2018-04-25 DIAGNOSIS — Z77098 Contact with and (suspected) exposure to other hazardous, chiefly nonmedicinal, chemicals: Secondary | ICD-10-CM

## 2018-04-25 DIAGNOSIS — Y990 Civilian activity done for income or pay: Secondary | ICD-10-CM | POA: Insufficient documentation

## 2018-04-25 DIAGNOSIS — R05 Cough: Secondary | ICD-10-CM | POA: Insufficient documentation

## 2018-04-25 NOTE — ED Notes (Signed)
Pt was asked to shower in [place] room, however had already been put in staff shower by PA Trisha Mangle.  [facility] Tammy notified, EVS notified to decontaminate shower and curtain.  Emergency Management on call notified, ER Director and Medical Director Dr Freida Busman notified.  Poison control notified and care and cleanup instructions received.

## 2018-04-25 NOTE — ED Notes (Signed)
Pt is in shower, washing off formalin

## 2018-04-25 NOTE — ED Notes (Signed)
Bed: WA16 Expected date:  Expected time:  Means of arrival:  Comments: Hold 

## 2018-04-25 NOTE — ED Notes (Signed)
Bed: WU98 Expected date:  Expected time:  Means of arrival:  Comments: [place]

## 2018-04-25 NOTE — ED Notes (Signed)
Writer spoke with Merdis Delay at [employer]. Keep eyes closed to form natural lubrication from eyes. After a feeling of no excessive tearing or or noted visual changes pt can be discharged. Pt will need to follow up with her personal ophthalmologist for evaluation if she continues to have problems or concerns. Pt is aware.

## 2018-04-25 NOTE — ED Provider Notes (Signed)
Emergency Department Provider Note   I have reviewed the triage vital signs and the nursing notes.   HISTORY  Chief Complaint Chemical Exposure   HPI Melanie Keller is a 27 y.o. female without significant past medical history who presents the emergency department today after being exposed to formalin.  Patient was carrying a placenta and a gallon of formalin and drop in the floor and fell down and the formalin went up into her eyes and she breathes some minute also splattered in her upper chest.  She came here immediately and was washed off at this time has no symptoms.  She did endorse having a few minutes of pretty consistent cough and difficulty breathing which seems to be improving.  She also states some eye irritation.  No change in vision but she does feel a foreign body sensation like her contact may be bunched up on her lower eyelid on the right.  Her left eye still has her contact in. No other associated or modifying symptoms.    History reviewed. No pertinent past medical history.  Patient Active Problem List   Diagnosis Date Noted  . Amenorrhea, secondary 07/15/2017  . Premature ovarian failure 07/13/2017  . LAD (lymphadenopathy), posterior cervical 03/28/2017  . Family history of breast cancer 03/28/2017  . FH: colon cancer in relative diagnosed at >80 years old 03/28/2017  . FHx: early menopause 03/28/2017    Past Surgical History:  Procedure Laterality Date  . TYMPANOSTOMY TUBE PLACEMENT Bilateral     Current Outpatient Rx  . Order #: [id number] Class: Normal    Allergies Patient has no known allergies.  Family History  Problem Relation Age of Onset  . Arthritis Mother   . Fibromyalgia Mother   . Early menopause Mother 40  . Cancer Father        prostate cancer  . Cancer Maternal Grandmother 45       breast cancer with brain Met  . Early menopause Maternal Grandmother 88  . Cancer Maternal Aunt 15       colon cancer    Social History Social  History   Tobacco Use  . Smoking status: Never Smoker  . Smokeless tobacco: Never Used  Substance Use Topics  . Alcohol use: Yes    Alcohol/week: 1.2 oz    Types: 2 Cans of beer per week    Comment: a week  . Drug use: No    Review of Systems  All other systems negative except as documented in the HPI. All pertinent positives and negatives as reviewed in the HPI. ____________________________________________   PHYSICAL EXAM:  VITAL SIGNS: ED Triage Vitals  Enc Vitals Group     BP 04/25/18 1732 (!) 137/97     Pulse Rate 04/25/18 1732 78     Resp 04/25/18 1732 20     Temp 04/25/18 1732 98.4 F (36.9 C)     Temp Source 04/25/18 1732 Oral     SpO2 04/25/18 1732 98 %    Constitutional: Alert and oriented. Well appearing and in no acute distress. Eyes: Conjunctivae are injected. PERRL. EOMI. Head: Atraumatic. Nose: No congestion/rhinnorhea. Mouth/Throat: Mucous membranes are moist.  Oropharynx non-erythematous. Neck: No stridor.  No meningeal signs.   Cardiovascular: Normal rate, regular rhythm. Good peripheral circulation. Grossly normal heart sounds.   Respiratory: Normal respiratory effort.  No retractions. Lungs CTAB. Gastrointestinal: Soft and nontender. No distention.  Musculoskeletal: No lower extremity tenderness nor edema. No gross deformities of extremities. Neurologic:  Normal speech and language.  No gross focal neurologic deficits are appreciated.  Skin:  Skin is warm, dry and intact. No rash noted.   ____________________________________________   [id number]  Dg Chest Portable 1 View  Result Date: 04/25/2018 CLINICAL DATA:  Cough.  Inhaled formalin earlier today. EXAM: PORTABLE CHEST 1 VIEW COMPARISON:  03/28/2017 FINDINGS: The heart size and mediastinal contours are within normal limits. Both lungs are clear. The visualized skeletal structures are unremarkable. IMPRESSION: Normal study. Electronically Signed   By: Earle Gell M.D.   On: 04/25/2018 18:32     ____________________________________________   INITIAL IMPRESSION / ASSESSMENT AND PLAN / ED COURSE  Poison control consulted and recommended resting with eyes closed for an hour.  She will take out her contact as well.  We will do an eye exam at that time and disposition appropriately.  Patient without her corrective lenses. She is a responsible patient and will see ophthalmologist if she has any change in vision, otherwise is clear.    Pertinent labs & imaging results that were available during my care of the patient were reviewed by me and considered in my medical decision making (see chart for details).  ____________________________________________  FINAL CLINICAL IMPRESSION(S) / ED DIAGNOSES  Final diagnoses:  Chemical exposure     MEDICATIONS GIVEN DURING THIS VISIT:  Medications - No data to display   NEW OUTPATIENT MEDICATIONS STARTED DURING THIS VISIT:  Discharge Medication List as of 04/25/2018  7:11 PM      Note:  This note was prepared with assistance of Dragon voice recognition software. Occasional wrong-word or sound-a-like substitutions may have occurred due to the inherent limitations of voice recognition software.   Merrily Pew, MD 04/25/18 [phone]

## 2018-04-25 NOTE — ED Triage Notes (Signed)
Pt states formalin splashed into her eyes, nose, and mouth. Pt states she is coughing and may have swallowed some of the formalin.

## 2018-04-30 ENCOUNTER — Ambulatory Visit: Payer: BLUE CROSS/BLUE SHIELD | Admitting: Psychology

## 2018-05-02 ENCOUNTER — Ambulatory Visit: Payer: BLUE CROSS/BLUE SHIELD | Admitting: Psychology

## 2018-05-23 IMAGING — DX DG CHEST 2V
2 series · 2 of 2 positions shown · non-contrast
Comparison: None.

CLINICAL DATA: Posterior cervical lymphadenopathy

EXAM:
CHEST  2 VIEW

[chest pa]
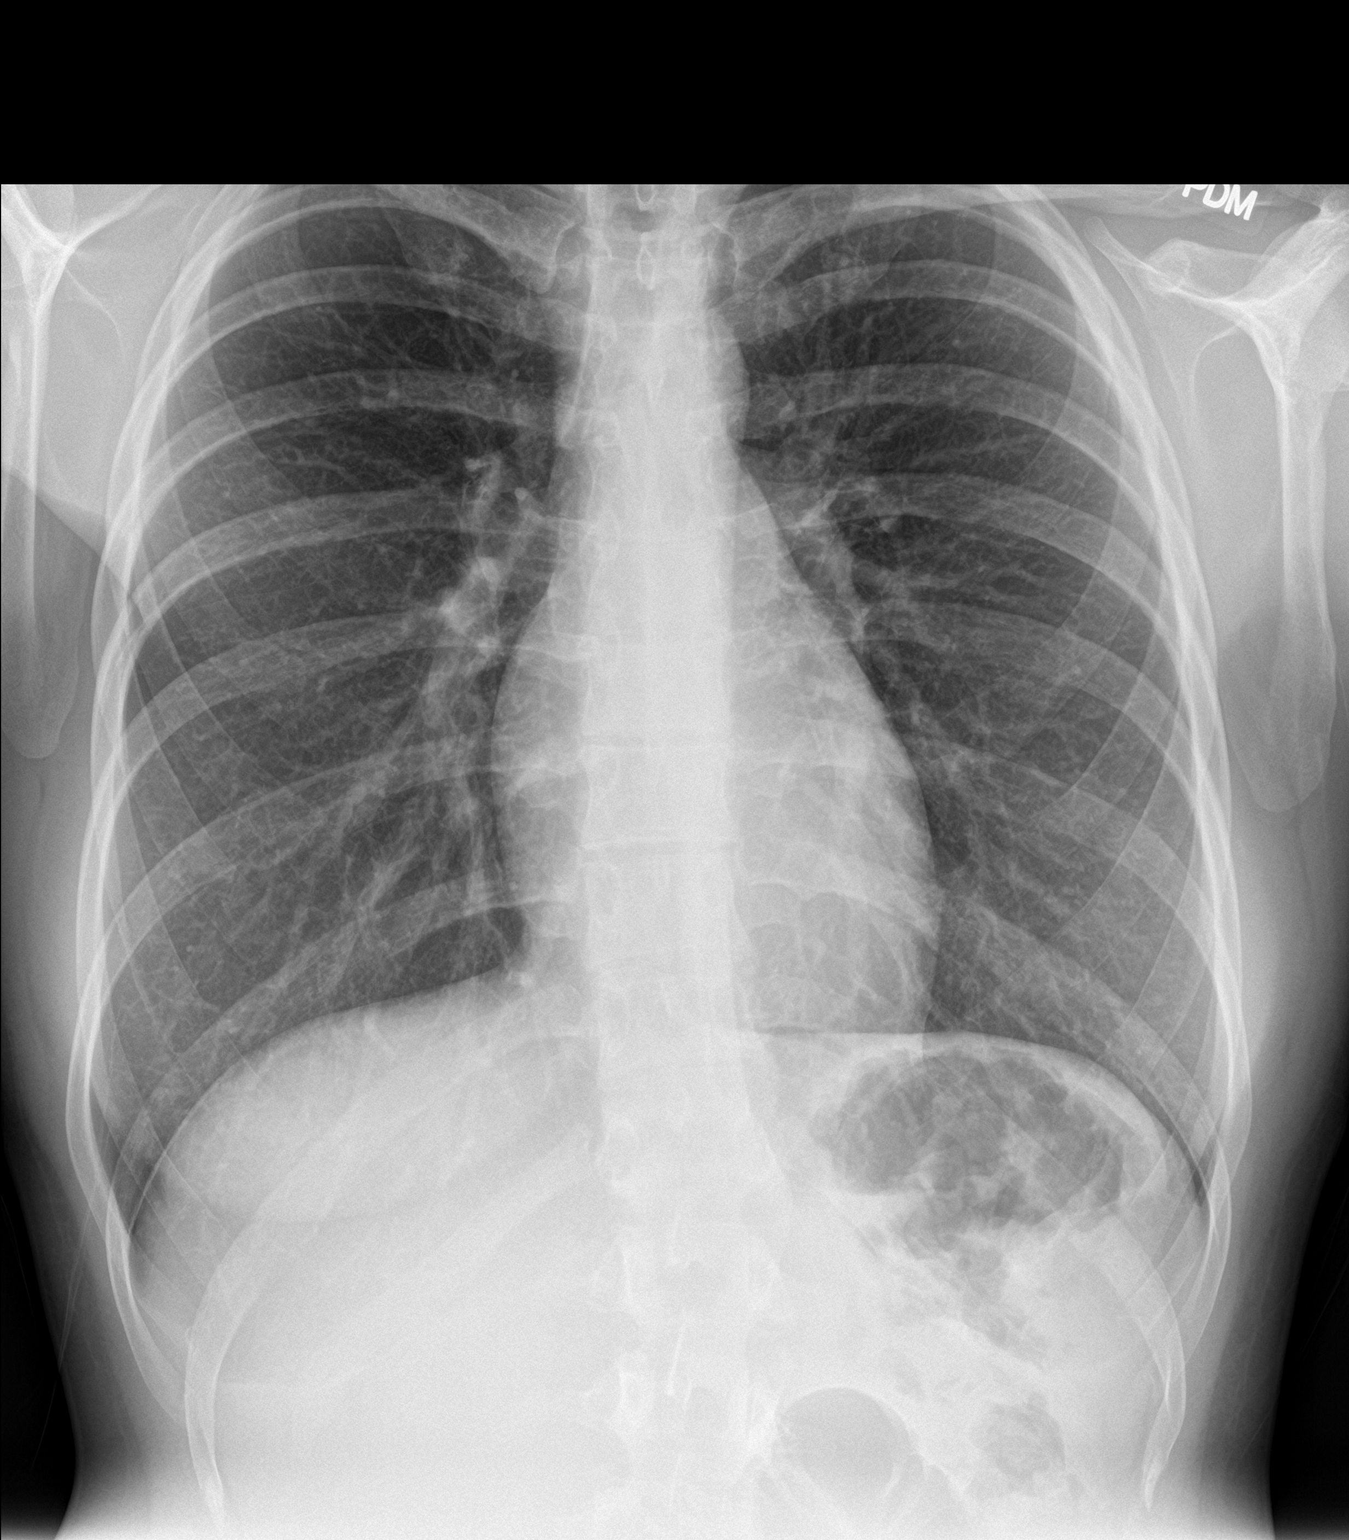

[chest lat]
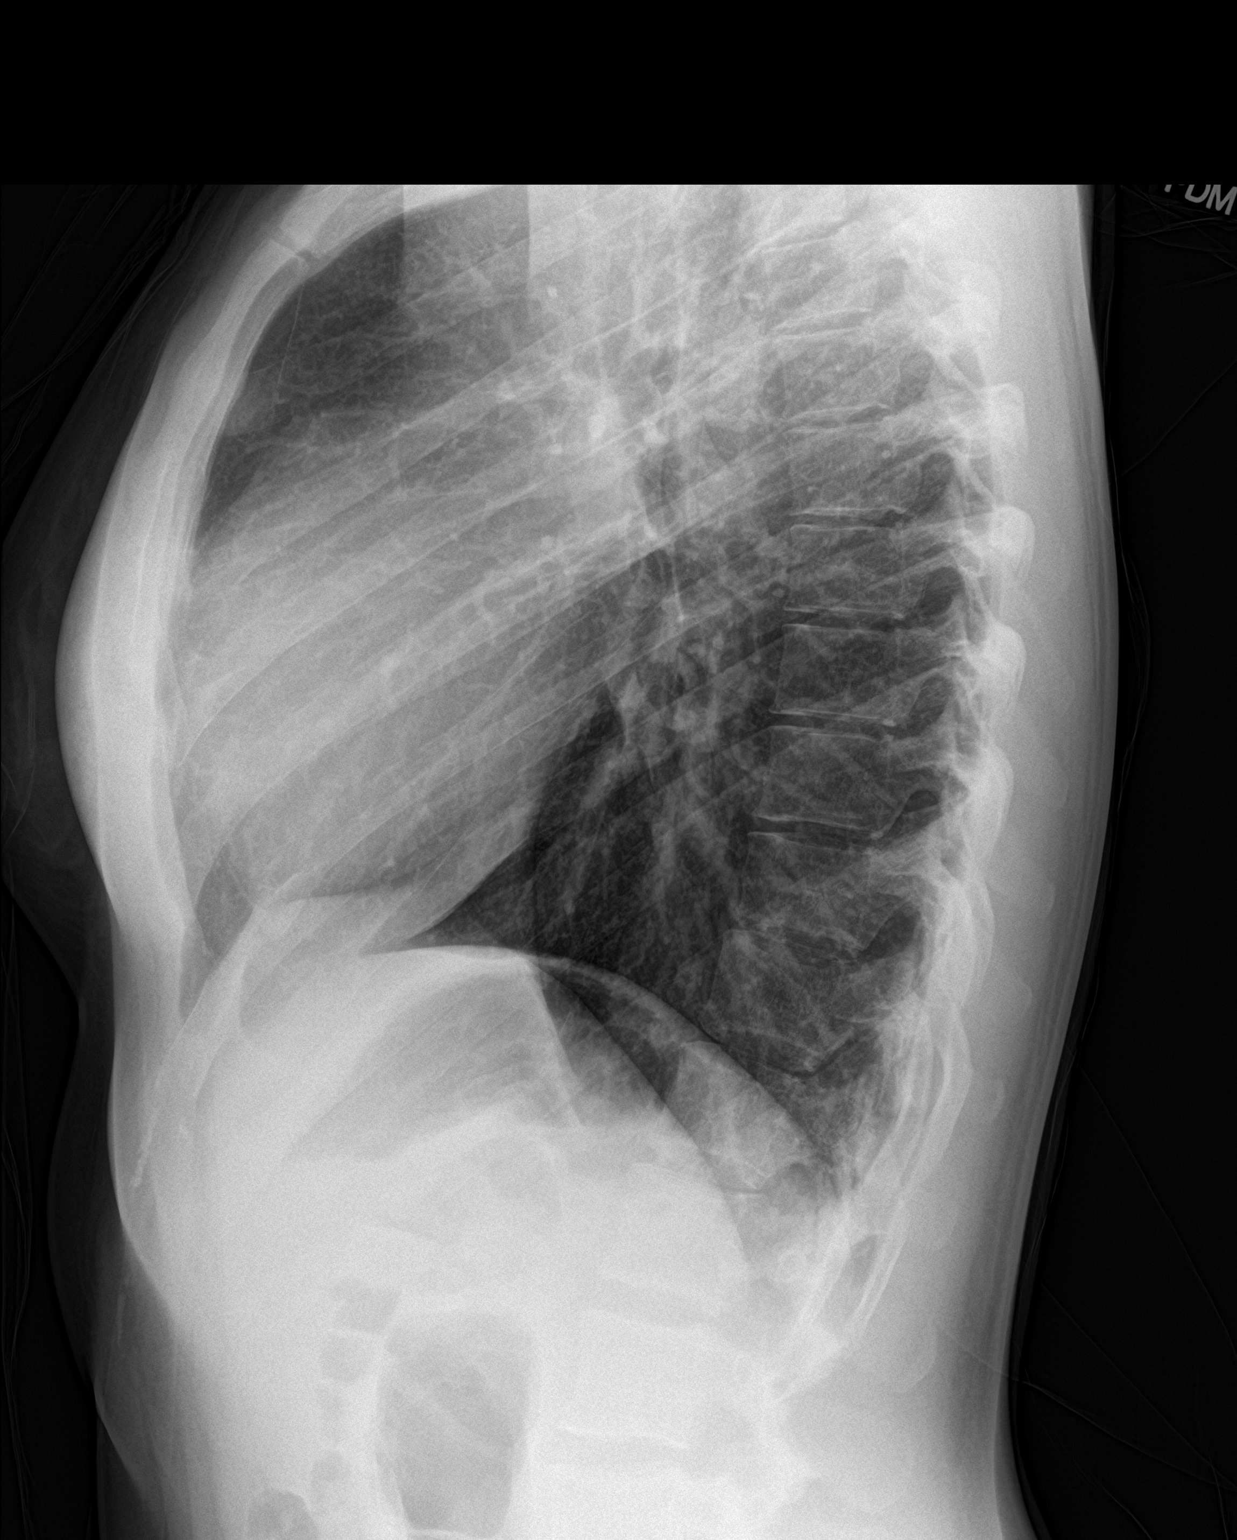

[2 of 2 positions shown; findings below may reference images not displayed]

FINDINGS: No active infiltrate or effusion is seen. Mediastinal and hilar
contours are unremarkable. There is no evidence of adenopathy. The
heart is within normal limits in size. No bony abnormality is seen.
IMPRESSION: No active cardiopulmonary disease.

## 2018-06-04 ENCOUNTER — Ambulatory Visit: Payer: BLUE CROSS/BLUE SHIELD | Admitting: Psychology

## 2018-06-20 ENCOUNTER — Ambulatory Visit: Payer: BLUE CROSS/BLUE SHIELD | Admitting: Psychology

## 2018-10-01 DIAGNOSIS — Z6824 Body mass index (BMI) 24.0-24.9, adult: Secondary | ICD-10-CM | POA: Diagnosis not present

## 2018-10-01 DIAGNOSIS — E288 Other ovarian dysfunction: Secondary | ICD-10-CM | POA: Diagnosis not present

## 2018-11-01 ENCOUNTER — Ambulatory Visit (INDEPENDENT_AMBULATORY_CARE_PROVIDER_SITE_OTHER): Payer: BLUE CROSS/BLUE SHIELD | Admitting: Family Medicine

## 2018-11-01 ENCOUNTER — Telehealth: Payer: Self-pay

## 2018-11-01 ENCOUNTER — Ambulatory Visit (INDEPENDENT_AMBULATORY_CARE_PROVIDER_SITE_OTHER): Payer: BLUE CROSS/BLUE SHIELD

## 2018-11-01 VITALS — BP 118/72 | HR 59 | Ht 66.0 in

## 2018-11-01 DIAGNOSIS — M25531 Pain in right wrist: Secondary | ICD-10-CM | POA: Diagnosis not present

## 2018-11-01 MED ORDER — DICLOFENAC SODIUM 2 % TD SOLN: 1.0000 "application " | Freq: Two times a day (BID) | TRANSDERMAL | 3 refills | Status: DC

## 2018-11-01 MED ORDER — IBUPROFEN-FAMOTIDINE 800-26.6 MG PO TABS: 1.0000 | ORAL_TABLET | Freq: Three times a day (TID) | ORAL | 3 refills | Status: DC

## 2018-11-01 NOTE — Assessment & Plan Note (Signed)
Findings suggestive of a TFCC tear.  Has swelling on the dorsal aspect and significant pain with extension.  - duexis and pennsaid  - wrist brace  - xray  - if no improvement then will need to consider MRI to evaluate for TFCC tear.

## 2018-11-01 NOTE — Telephone Encounter (Signed)
Discovered that pt was not brought to x-ray area to have their wrist x-ray done. Called the patient and left the following message:  " stated I was the x-ray tech from [place] and noticed she hadn't gotten her wrist x-ray done that if she would call the office back we could schedule for her to have it done at her earliest convience."  -DMG

## 2018-11-01 NOTE — Progress Notes (Signed)
Melanie Keller - 27 y.o. female MRN [id number]  Date of birth: 1991-03-02  SUBJECTIVE:  Including CC & ROS.  Chief Complaint  Patient presents with  . Wrist Pain    yesterday at [facility] while doing a lift, she hurt right wrist.    Melanie Keller is a 27 y.o. female that is presenting with right wrist pain.  The pain occurred yesterday when she was performing a hand clean.  She felt a pop occurring in her wrist.  She has had significant pain with wrist extension since that moment.  She had some swelling on the dorsal wrist.  Pain is localized to this area.  She does not have pain as she does not move her wrist.  She is taken ibuprofen for the pain.  Pain is severe when it is occurring.  Is intermittent in nature.  She has had no similar instances in the past.  She is right-handed.   Review of Systems  Constitutional: Negative for fever.  HENT: Negative for congestion.   Respiratory: Negative for cough.   Cardiovascular: Negative for chest pain.  Gastrointestinal: Negative for abdominal pain.  Musculoskeletal: Negative for gait problem.  Skin: Negative for color change.  Neurological: Negative for numbness.  Hematological: Negative for adenopathy.  Psychiatric/Behavioral: Negative for agitation.    HISTORY: Past Medical, Surgical, Social, and Family History Reviewed & Updated per EMR.   Pertinent Historical Findings include:  No past medical history on file.  Past Surgical History:  Procedure Laterality Date  . TYMPANOSTOMY TUBE PLACEMENT Bilateral     No Known Allergies  Family History  Problem Relation Age of Onset  . Arthritis Mother   . Fibromyalgia Mother   . Early menopause Mother 61  . Cancer Father        prostate cancer  . Cancer Maternal Grandmother 45       breast cancer with brain Met  . Early menopause Maternal Grandmother 57  . Cancer Maternal Aunt 50       colon cancer     Social History   Socioeconomic History  . Marital status: Unknown   Spouse name: Not on file  . Number of children: Not on file  . Years of education: Not on file  . Highest education level: Not on file  Occupational History  . Not on file  Social Needs  . Financial resource strain: Not on file  . Food insecurity:    Worry: Not on file    Inability: Not on file  . Transportation needs:    Medical: Not on file    Non-medical: Not on file  Tobacco Use  . Smoking status: Never Smoker  . Smokeless tobacco: Never Used  Substance and Sexual Activity  . Alcohol use: Yes    Alcohol/week: 2.0 standard drinks    Types: 2 Cans of beer per week    Comment: a week  . Drug use: No  . Sexual activity: Yes  Lifestyle  . Physical activity:    Days per week: Not on file    Minutes per session: Not on file  . Stress: Not on file  Relationships  . Social connections:    Talks on phone: Not on file    Gets together: Not on file    Attends religious service: Not on file    Active member of club or organization: Not on file    Attends meetings of clubs or organizations: Not on file    Relationship status: Not on file  .  Intimate partner violence:    Fear of current or ex partner: Not on file    Emotionally abused: Not on file    Physically abused: Not on file    Forced sexual activity: Not on file  Other Topics Concern  . Not on file  Social History Narrative  . Not on file     PHYSICAL EXAM:  VS: BP 118/72 (BP Location: Left Arm, Patient Position: Sitting, Cuff Size: Normal)   Pulse (!) 59   Ht '5\' 6"'  (1.676 m)   SpO2 99%   BMI 23.92 kg/m  Physical Exam Gen: NAD, alert, cooperative with exam, well-appearing ENT: normal lips, normal nasal mucosa,  Eye: normal EOM, normal conjunctiva and lids CV:  no edema, +2 pedal pulses   Resp: no accessory muscle use, non-labored,  Skin: no rashes, no areas of induration  Neuro: normal tone, normal sensation to touch Psych:  normal insight, alert and oriented MSK:  Right wrist: Swelling occurring on the  dorsal carpal bones. Pain with wrist extension. Pain with ulnar deviation. Normal strength resistance with finger abduction and abduction. Normal strength resistance with thumb extension. No click appreciated. Neurovascularly intact  Limited ultrasound: Right wrist:  Normal-appearing distal radius and ulna. Normal-appearing dorsal compartment tendons. Normal-appearing scaphoid. TFCC appears to have a hypoechoic change in effusion to suggest a tear or rupture  Summary: Findings suggestive TFCC tear  Ultrasound and interpretation by Clearance Coots, MD      ASSESSMENT & PLAN:   Right wrist pain Findings suggestive of a TFCC tear.  Has swelling on the dorsal aspect and significant pain with extension.  - duexis and pennsaid  - wrist brace  - xray  - if no improvement then will need to consider MRI to evaluate for TFCC tear.

## 2018-11-01 NOTE — Patient Instructions (Signed)
Nice to meet you  Please use the brace  Please try the medicine  Please use ice  Please let me know when the swelling comes out of the wrist  I will call you with the results from today.

## 2018-11-13 DIAGNOSIS — M9901 Segmental and somatic dysfunction of cervical region: Secondary | ICD-10-CM | POA: Diagnosis not present

## 2018-11-13 DIAGNOSIS — M9907 Segmental and somatic dysfunction of upper extremity: Secondary | ICD-10-CM | POA: Diagnosis not present

## 2018-11-13 DIAGNOSIS — M9902 Segmental and somatic dysfunction of thoracic region: Secondary | ICD-10-CM | POA: Diagnosis not present

## 2018-11-15 DIAGNOSIS — M9907 Segmental and somatic dysfunction of upper extremity: Secondary | ICD-10-CM | POA: Diagnosis not present

## 2018-11-15 DIAGNOSIS — M9901 Segmental and somatic dysfunction of cervical region: Secondary | ICD-10-CM | POA: Diagnosis not present

## 2018-11-15 DIAGNOSIS — M9902 Segmental and somatic dysfunction of thoracic region: Secondary | ICD-10-CM | POA: Diagnosis not present

## 2018-11-20 MED FILL — TAYTULLA 1 MG-20 MCG CAP: 1-20 | 84 days supply | Qty: 84 | Fill #0

## 2018-12-14 DIAGNOSIS — E2839 Other primary ovarian failure: Secondary | ICD-10-CM | POA: Diagnosis not present

## 2019-02-26 MED FILL — BLISOVI 24 FE 1-20 MG-MCG(2: 1-20 | 84 days supply | Qty: 84 | Fill #0

## 2019-07-16 MED FILL — BLISOVI 24 FE 1-20 MG-MCG(2: 1-20 | 84 days supply | Qty: 84 | Fill #1

## 2019-07-23 ENCOUNTER — Other Ambulatory Visit: Payer: Self-pay

## 2019-07-23 ENCOUNTER — Encounter: Payer: Self-pay | Admitting: Nurse Practitioner

## 2019-07-23 ENCOUNTER — Encounter: Payer: BC Managed Care – PPO | Admitting: Nurse Practitioner

## 2019-07-23 DIAGNOSIS — R6889 Other general symptoms and signs: Secondary | ICD-10-CM | POA: Diagnosis not present

## 2019-07-23 DIAGNOSIS — Z20822 Contact with and (suspected) exposure to covid-19: Secondary | ICD-10-CM

## 2019-07-24 NOTE — Progress Notes (Signed)
This encounter was created in error - please disregard.

## 2019-07-25 LAB — NOVEL CORONAVIRUS, NAA: SARS-CoV-2, NAA: NOT DETECTED

## 2019-11-23 DIAGNOSIS — S46911A Strain of unspecified muscle, fascia and tendon at shoulder and upper arm level, right arm, initial encounter: Secondary | ICD-10-CM | POA: Diagnosis not present

## 2019-11-25 DIAGNOSIS — M9901 Segmental and somatic dysfunction of cervical region: Secondary | ICD-10-CM | POA: Diagnosis not present

## 2019-11-25 DIAGNOSIS — M9907 Segmental and somatic dysfunction of upper extremity: Secondary | ICD-10-CM | POA: Diagnosis not present

## 2019-11-25 DIAGNOSIS — M9902 Segmental and somatic dysfunction of thoracic region: Secondary | ICD-10-CM | POA: Diagnosis not present

## 2019-11-28 DIAGNOSIS — M9902 Segmental and somatic dysfunction of thoracic region: Secondary | ICD-10-CM | POA: Diagnosis not present

## 2019-11-28 DIAGNOSIS — M9907 Segmental and somatic dysfunction of upper extremity: Secondary | ICD-10-CM | POA: Diagnosis not present

## 2019-11-28 DIAGNOSIS — M9901 Segmental and somatic dysfunction of cervical region: Secondary | ICD-10-CM | POA: Diagnosis not present

## 2020-03-06 MED FILL — CITALOPRAM HBR 10 MG TABLET: 10 | 30 days supply | Qty: 30 | Fill #0

## 2020-04-06 MED FILL — CITALOPRAM HBR 10 MG TABLET: 10 | 30 days supply | Qty: 30 | Fill #1

## 2020-04-07 ENCOUNTER — Telehealth: Payer: Self-pay | Admitting: Nurse Practitioner

## 2020-04-07 NOTE — Telephone Encounter (Signed)
Ok with me 

## 2020-04-07 NOTE — Telephone Encounter (Signed)
ok 

## 2020-04-07 NOTE — Telephone Encounter (Signed)
Pt called requesting to transfer care over to [facility] with Jarold Motto. Pt states she wants to go to a practice closer to where she lives.  Please let me know if this is approved and I will schedule her appointment!

## 2020-04-08 NOTE — Telephone Encounter (Signed)
Scheduled appt with [name].  

## 2020-05-11 ENCOUNTER — Other Ambulatory Visit: Payer: Self-pay | Admitting: Physician Assistant

## 2020-05-11 ENCOUNTER — Telehealth (INDEPENDENT_AMBULATORY_CARE_PROVIDER_SITE_OTHER): Payer: BC Managed Care – PPO | Admitting: Physician Assistant

## 2020-05-11 ENCOUNTER — Encounter: Payer: Self-pay | Admitting: Physician Assistant

## 2020-05-11 VITALS — Ht 66.0 in | Wt 148.0 lb

## 2020-05-11 DIAGNOSIS — F320 Major depressive disorder, single episode, mild: Secondary | ICD-10-CM | POA: Diagnosis not present

## 2020-05-11 DIAGNOSIS — E2839 Other primary ovarian failure: Secondary | ICD-10-CM | POA: Diagnosis not present

## 2020-05-11 DIAGNOSIS — N393 Stress incontinence (female) (male): Secondary | ICD-10-CM | POA: Diagnosis not present

## 2020-05-11 MED ORDER — CITALOPRAM HYDROBROMIDE 10 MG PO TABS: 10.0000 mg | ORAL_TABLET | Freq: Every day | ORAL | 3 refills | Status: DC

## 2020-05-11 MED ORDER — NORGESTIMATE-ETH ESTRADIOL 0.25-35 MG-MCG PO TABS: 1.0000 | ORAL_TABLET | Freq: Every day | ORAL | 4 refills | Status: DC

## 2020-05-11 MED FILL — VYLIBRA 0.25-35 MG-MCG TABS: 0.25-35 | 84 days supply | Qty: 84 | Fill #0

## 2020-05-11 NOTE — Progress Notes (Signed)
Virtual Visit via Video   I connected with Melanie Keller on 05/11/20 at  3:00 PM EDT by a video enabled telemedicine application and verified that I am speaking with the correct person using two identifiers. Location patient: Home Location provider: [facility], Office Persons participating in the virtual visit: Sima Lindenberger, Inda Coke PA-C  I discussed the limitations of evaluation and management by telemedicine and the availability of in person appointments. The patient expressed understanding and agreed to proceed.   Subjective:   HPI:   Depression Patient is currently taking Citalopram 10 MG daily. She has been on this for 3 months and feels like it has made a significant improvement in her symptoms. Patient is compliant with medication. No side effects. PHQ-9 score is 0. Denies SI/HI.  Stress Incontinence Patient does crossfit regularly and has noticed some stress incontinence. She has not tried anything for her symptoms.  Premature Ovarian Failure In 2017-2018 essentially went through early menopause, per patient. She was given an rx for OCPs which she would take for hot flashes and other symptoms. She states that her insurance stopped paying for her OCP so it had to be discontinued. She feels like her hot flashes are worsening and she is wanting to resume her OCPs.  ROS: See pertinent positives and negatives per HPI.  Patient Active Problem List   Diagnosis Date Noted  . Right wrist pain 11/01/2018  . Amenorrhea, secondary 07/15/2017  . Premature ovarian failure 07/13/2017  . LAD (lymphadenopathy), posterior cervical 03/28/2017  . Family history of breast cancer 03/28/2017  . FH: colon cancer in relative diagnosed at >50 years old 03/28/2017  . FHx: early menopause 03/28/2017    Social History   Tobacco Use  . Smoking status: Never Smoker  . Smokeless tobacco: Never Used  Substance Use Topics  . Alcohol use: Yes    Alcohol/week: 2.0 standard drinks     Types: 2 Cans of beer per week    Comment: a week    Current Outpatient Medications:  .  citalopram (CELEXA) 10 MG tablet, Take 1 tablet (10 mg total) by mouth daily., Disp: 90 tablet, Rfl: 3 .  Ibuprofen-Famotidine 800-26.6 MG TABS, Take 1 tablet by mouth 3 (three) times daily. (Patient not taking: Reported on 05/11/2020), Disp: 90 tablet, Rfl: 3 .  norgestimate-ethinyl estradiol (SPRINTEC 28) 0.25-35 MG-MCG tablet, Take 1 tablet by mouth daily., Disp: 3 Package, Rfl: 4 .  Norgestimate-Ethinyl Estradiol Triphasic (TRI-SPRINTEC) 0.18/0.215/0.25 MG-35 MCG tablet, Take 1 tablet by mouth daily. (Patient not taking: Reported on 05/11/2020), Disp: 1 Package, Rfl: 11  No Known Allergies  Objective:   VITALS: Per patient if applicable, see vitals. GENERAL: Alert, appears well and in no acute distress. HEENT: Atraumatic, conjunctiva clear, no obvious abnormalities on inspection of external nose and ears. NECK: Normal movements of the head and neck. CARDIOPULMONARY: No increased WOB. Speaking in clear sentences. I:E ratio WNL.  MS: Moves all visible extremities without noticeable abnormality. PSYCH: Pleasant and cooperative, well-groomed. Speech normal rate and rhythm. Affect is appropriate. Insight and judgement are appropriate. Attention is focused, linear, and appropriate.  NEURO: CN grossly intact. Oriented as arrived to appointment on time with no prompting. Moves both UE equally.  SKIN: No obvious lesions, wounds, erythema, or cyanosis noted on face or hands.  Assessment and Plan:   Dorri was seen today for transitions of care and depression.  Diagnoses and all orders for this visit:  Depression, major, single episode, mild ([place]) Well controlled. Continue Celexa  10 mg daily. Follow-up in 42mo to 32yr, sooner if concerns.  Stress incontinence Offered Pelvic Floor PT -- she is going to consider this. Follow-up as needed.  Premature ovarian failure Patient has requested OCP. I have  sent this in for her, if she develops any further concerns, will refer to gynecology for further evaluation and management.  Other orders -     citalopram (CELEXA) 10 MG tablet; Take 1 tablet (10 mg total) by mouth daily. -     norgestimate-ethinyl estradiol (SPRINTEC 28) 0.25-35 MG-MCG tablet; Take 1 tablet by mouth daily.   . Reviewed expectations re: course of current medical issues. . Discussed self-management of symptoms. . Outlined signs and symptoms indicating need for more acute intervention. . Patient verbalized understanding and all questions were answered. Marland Kitchen Health Maintenance issues including appropriate healthy diet, exercise, and smoking avoidance were discussed with patient. . See orders for this visit as documented in the electronic medical record.  I discussed the assessment and treatment plan with the patient. The patient was provided an opportunity to ask questions and all were answered. The patient agreed with the plan and demonstrated an understanding of the instructions.   The patient was advised to call back or seek an in-person evaluation if the symptoms worsen or if the condition fails to improve as anticipated.   CMA or LPN served as scribe during this visit. History, Physical, and Plan performed by medical provider. The above documentation has been reviewed and is accurate and complete.   [place], [place] 05/11/2020

## 2020-05-12 MED FILL — CITALOPRAM HBR 10 MG TABLET: 10 | 90 days supply | Qty: 90 | Fill #0

## 2020-07-22 MED FILL — VYLIBRA 0.25-35 MG-MCG TABS: 0.25-35 | 84 days supply | Qty: 84 | Fill #1

## 2020-08-10 ENCOUNTER — Encounter: Payer: BC Managed Care – PPO | Admitting: Physician Assistant

## 2020-08-17 ENCOUNTER — Other Ambulatory Visit (HOSPITAL_COMMUNITY)
Admission: RE | Admit: 2020-08-17 | Discharge: 2020-08-17 | Disposition: A | Discharge status: Home / Self Care | Payer: BC Managed Care – PPO | Source: Ambulatory Visit | Attending: Physician Assistant | Admitting: Physician Assistant

## 2020-08-17 ENCOUNTER — Ambulatory Visit (INDEPENDENT_AMBULATORY_CARE_PROVIDER_SITE_OTHER): Payer: BC Managed Care – PPO | Admitting: Physician Assistant

## 2020-08-17 ENCOUNTER — Encounter: Payer: Self-pay | Admitting: Physician Assistant

## 2020-08-17 ENCOUNTER — Other Ambulatory Visit: Payer: Self-pay

## 2020-08-17 VITALS — BP 110/80 | HR 49 | Temp 98.1°F | Ht 66.0 in | Wt 157.5 lb

## 2020-08-17 DIAGNOSIS — Z1159 Encounter for screening for other viral diseases: Secondary | ICD-10-CM | POA: Diagnosis not present

## 2020-08-17 DIAGNOSIS — Z13220 Encounter for screening for lipoid disorders: Secondary | ICD-10-CM

## 2020-08-17 DIAGNOSIS — Z124 Encounter for screening for malignant neoplasm of cervix: Secondary | ICD-10-CM

## 2020-08-17 DIAGNOSIS — F320 Major depressive disorder, single episode, mild: Secondary | ICD-10-CM | POA: Diagnosis not present

## 2020-08-17 DIAGNOSIS — E2839 Other primary ovarian failure: Secondary | ICD-10-CM

## 2020-08-17 DIAGNOSIS — Z0001 Encounter for general adult medical examination with abnormal findings: Secondary | ICD-10-CM

## 2020-08-17 DIAGNOSIS — Z136 Encounter for screening for cardiovascular disorders: Secondary | ICD-10-CM | POA: Diagnosis not present

## 2020-08-17 NOTE — Patient Instructions (Addendum)
It was great to see you!  I will put in referral for [facility].  Please go to the lab for blood work.   Our office will call you with your results unless you have chosen to receive results via MyChart.  If your blood work is normal we will follow-up each year for physicals and as scheduled for chronic medical problems.  If anything is abnormal we will treat accordingly and get you in for a follow-up.  Take care,  [facility] Maintenance, Female Adopting a healthy lifestyle and getting preventive care are important in promoting health and wellness. Ask your health care provider about:  The right schedule for you to have regular tests and exams.  Things you can do on your own to prevent diseases and keep yourself healthy. What should I know about diet, weight, and exercise? Eat a healthy diet   Eat a diet that includes plenty of vegetables, fruits, low-fat dairy products, and lean protein.  Do not eat a lot of foods that are high in solid fats, added sugars, or sodium. Maintain a healthy weight Body mass index (BMI) is used to identify weight problems. It estimates body fat based on height and weight. Your health care provider can help determine your BMI and help you achieve or maintain a healthy weight. Get regular exercise Get regular exercise. This is one of the most important things you can do for your health. Most adults should:  Exercise for at least 150 minutes each week. The exercise should increase your heart rate and make you sweat (moderate-intensity exercise).  Do strengthening exercises at least twice a week. This is in addition to the moderate-intensity exercise.  Spend less time sitting. Even light physical activity can be beneficial. Watch cholesterol and blood lipids Have your blood tested for lipids and cholesterol at 29 years of age, then have this test every 5 years. Have your cholesterol levels checked more often if:  Your  lipid or cholesterol levels are high.  You are older than 29 years of age.  You are at high risk for heart disease. What should I know about cancer screening? Depending on your health history and family history, you may need to have cancer screening at various ages. This may include screening for:  Breast cancer.  Cervical cancer.  Colorectal cancer.  Skin cancer.  Lung cancer. What should I know about heart disease, diabetes, and high blood pressure? Blood pressure and heart disease  High blood pressure causes heart disease and increases the risk of stroke. This is more likely to develop in people who have high blood pressure readings, are of African descent, or are overweight.  Have your blood pressure checked: ? Every 3-5 years if you are 33-49 years of age. ? Every year if you are 92 years old or older. Diabetes Have regular diabetes screenings. This checks your fasting blood sugar level. Have the screening done:  Once every three years after age 45 if you are at a normal weight and have a low risk for diabetes.  More often and at a younger age if you are overweight or have a high risk for diabetes. What should I know about preventing infection? Hepatitis B If you have a higher risk for hepatitis B, you should be screened for this virus. Talk with your health care provider to find out if you are at risk for hepatitis B infection. Hepatitis C Testing is recommended for:  Everyone born from [birth year] through 1965.  Anyone with known risk factors for hepatitis C. Sexually transmitted infections (STIs)  Get screened for STIs, including gonorrhea and chlamydia, if: ? You are sexually active and are younger than 29 years of age. ? You are older than 29 years of age and your health care provider tells you that you are at risk for this type of infection. ? Your sexual activity has changed since you were last screened, and you are at increased risk for chlamydia or gonorrhea. Ask  your health care provider if you are at risk.  Ask your health care provider about whether you are at high risk for HIV. Your health care provider may recommend a prescription medicine to help prevent HIV infection. If you choose to take medicine to prevent HIV, you should first get tested for HIV. You should then be tested every 3 months for as long as you are taking the medicine. Pregnancy  If you are about to stop having your period (premenopausal) and you may become pregnant, seek counseling before you get pregnant.  Take 400 to 800 micrograms (mcg) of folic acid every day if you become pregnant.  Ask for birth control (contraception) if you want to prevent pregnancy. Osteoporosis and menopause Osteoporosis is a disease in which the bones lose minerals and strength with aging. This can result in bone fractures. If you are 38 years old or older, or if you are at risk for osteoporosis and fractures, ask your health care provider if you should:  Be screened for bone loss.  Take a calcium or vitamin D supplement to lower your risk of fractures.  Be given hormone replacement therapy (HRT) to treat symptoms of menopause. Follow these instructions at home: Lifestyle  Do not use any products that contain nicotine or tobacco, such as cigarettes, e-cigarettes, and chewing tobacco. If you need help quitting, ask your health care provider.  Do not use street drugs.  Do not share needles.  Ask your health care provider for help if you need support or information about quitting drugs. Alcohol use  Do not drink alcohol if: ? Your health care provider tells you not to drink. ? You are pregnant, may be pregnant, or are planning to become pregnant.  If you drink alcohol: ? Limit how much you use to 0-1 drink a day. ? Limit intake if you are breastfeeding.  Be aware of how much alcohol is in your drink. In the [place] one drink equals one 12 oz bottle of beer (355 mL), one 5 oz glass of wine  (148 mL), or one 1 oz glass of hard liquor (44 mL). General instructions  Schedule regular health, dental, and eye exams.  Stay current with your vaccines.  Tell your health care provider if: ? You often feel depressed. ? You have ever been abused or do not feel safe at home. Summary  Adopting a healthy lifestyle and getting preventive care are important in promoting health and wellness.  Follow your health care provider's instructions about healthy diet, exercising, and getting tested or screened for diseases.  Follow your health care provider's instructions on monitoring your cholesterol and blood pressure. This information is not intended to replace advice given to you by your health care provider. Make sure you discuss any questions you have with your health care provider. Document Revised: 12/05/2018 Document Reviewed: 12/05/2018 Elsevier Patient Education  2020 [employer].

## 2020-08-17 NOTE — Progress Notes (Signed)
I acted as a Education administrator for [employer], PA-C Anselmo Pickler, LPN   Subjective:    Melanie Keller is a 29 y.o. female and is here for a comprehensive physical exam.  HPI  Health Maintenance Due  Topic Date Due  . Hepatitis C Screening  Never done  . PAP-Cervical Cytology Screening  Never done  . PAP SMEAR-Modifier  Never done   Acute Concerns: Infertility clinic referral -- she and her partner are hoping to have a baby. Patient has premature ovarian failure. She would like a referral to [facility] for further evaluation.  Chronic Issues: Depression -- currently on celexa 10 mg daily. She is tolerating this well. Denies SI/HI. Feels like her mood is stable.  Health Maintenance: Immunizations -- UTD, will get Flu and Tdap at [facility]. Colonoscopy -- N/A Mammogram -- N/A PAP -- overdue, will do today Bone Density -- N/A Diet -- eats all food groups Sleep habits -- overall good Exercise -- does crossfit Current Weight -- Weight: 157 lb 8 oz (71.4 kg)  Weight History: Wt Readings from Last 10 Encounters:  08/17/20 157 lb 8 oz (71.4 kg)  05/11/20 148 lb (67.1 kg)  07/13/17 148 lb 3.2 oz (67.2 kg)  04/25/17 148 lb (67.1 kg)  03/28/17 149 lb (67.6 kg)   Body mass index is 25.42 kg/m. Mood -- good No LMP recorded. (Menstrual status: Other). Birth control -- OCPs Alcohol -- has reduced intake; over the weekend; a few beers  Depression screen [facility] 2/9 08/17/2020  Decreased Interest 0  Down, Depressed, Hopeless 0  PHQ - 2 Score 0  Altered sleeping 1  Tired, decreased energy 0  Change in appetite 1  Feeling bad or failure about yourself  0  Trouble concentrating 0  Moving slowly or fidgety/restless 0  Suicidal thoughts 0  PHQ-9 Score 2  Difficult doing work/chores Not difficult at all     Other providers/specialists: Patient Care Team: Inda Coke, [place] as PCP - General (Physician Assistant)   PMHx, SurgHx, SocialHx, Medications, and  Allergies were reviewed in the Visit Navigator and updated as appropriate.   History reviewed. No pertinent past medical history.   Past Surgical History:  Procedure Laterality Date  . TYMPANOSTOMY TUBE PLACEMENT Bilateral      Family History  Problem Relation Age of Onset  . Arthritis Mother   . Fibromyalgia Mother   . Early menopause Mother 28  . Cancer Father        prostate cancer  . Cancer Maternal Grandmother 45       breast cancer with brain Met  . Early menopause Maternal Grandmother 53  . Cancer Maternal Aunt 50       colon cancer    Social History   Tobacco Use  . Smoking status: Never Smoker  . Smokeless tobacco: Never Used  Substance Use Topics  . Alcohol use: Yes    Alcohol/week: 2.0 standard drinks    Types: 2 Cans of beer per week    Comment: a week  . Drug use: No    Review of Systems:   Review of Systems  Constitutional: Negative for chills, fever, malaise/fatigue and weight loss.  HENT: Negative for hearing loss, sinus pain and sore throat.   Eyes: Negative for blurred vision.  Respiratory: Negative for cough and shortness of breath.   Cardiovascular: Negative for chest pain, palpitations and leg swelling.  Gastrointestinal: Negative for abdominal pain, constipation, diarrhea, heartburn, nausea and vomiting.  Genitourinary: Negative for dysuria,  frequency and urgency.  Musculoskeletal: Negative for back pain, myalgias and neck pain.  Skin: Negative for itching and rash.  Neurological: Negative for dizziness, tingling, seizures, loss of consciousness and headaches.  Endo/Heme/Allergies: Negative for polydipsia.  Psychiatric/Behavioral: Negative for depression. The patient is not nervous/anxious.   All other systems reviewed and are negative.   Objective:   BP 110/80 (BP Location: Left Arm, Patient Position: Sitting, Cuff Size: Normal)   Pulse (!) 49   Temp 98.1 F (36.7 C) (Temporal)   Ht '5\' 6"'  (1.676 m)   Wt 157 lb 8 oz (71.4 kg)    SpO2 98%   BMI 25.42 kg/m   General Appearance:    Alert, cooperative, no distress, appears stated age  Head:    Normocephalic, without obvious abnormality, atraumatic  Eyes:    PERRL, conjunctiva/corneas clear, EOM's intact, fundi    benign, both eyes  Ears:    Normal TM's and external ear canals, both ears  Nose:   Nares normal, septum midline, mucosa normal, no drainage    or sinus tenderness  Throat:   Lips, mucosa, and tongue normal; teeth and gums normal  Neck:   Supple, symmetrical, trachea midline, no adenopathy;    thyroid:  no enlargement/tenderness/nodules; no carotid   bruit or JVD  Back:     Symmetric, no curvature, ROM normal, no CVA tenderness  Lungs:     Clear to auscultation bilaterally, respirations unlabored  Chest Wall:    No tenderness or deformity   Heart:    Regular rate and rhythm, S1 and S2 normal, no murmur, rub   or gallop  Breast Exam:    No tenderness, masses, or nipple abnormality  Abdomen:     Soft, non-tender, bowel sounds active all four quadrants,    no masses, no organomegaly  Genitalia:    Normal female without lesion, discharge or tenderness  Rectal:    Normal tone no masses or tenderness  Extremities:   Extremities normal, atraumatic, no cyanosis or edema  Pulses:   2+ and symmetric all extremities  Skin:   Skin color, texture, turgor normal, no rashes or lesions  Lymph nodes:   Cervical, supraclavicular, and axillary nodes normal  Neurologic:   CNII-XII intact, normal strength, sensation and reflexes    throughout     Assessment/Plan:   Flynn was seen today for annual exam.  Diagnoses and all orders for this visit:  Encounter for general adult medical examination with abnormal findings Today patient counseled on age appropriate routine health concerns for screening and prevention, each reviewed and up to date or declined. Immunizations reviewed and up to date or declined. Labs ordered and reviewed. Risk factors for depression reviewed  and negative. Hearing function and visual acuity are intact. ADLs screened and addressed as needed. Functional ability and level of safety reviewed and appropriate. Education, counseling and referrals performed based on assessed risks today. Patient provided with a copy of personalized plan for preventive services.  Depression, major, single episode, mild ([place]) Currently well controlled. Continue celexa 10 mg daily. Follow-up in 1 year, sooner if concerns. -     CBC with Differential/Platelet; Future -     Comprehensive metabolic panel; Future  Premature ovarian failure Referral to fertility clinic per patient request. -     Ambulatory referral to Endocrinology  Encounter for lipid screening for cardiovascular disease -     Lipid panel; Future  Encounter for screening for other viral diseases -     Hepatitis C  antibody; Future  Pap smear for cervical cancer screening -     Cytology - PAP   Well Adult Exam: Labs ordered: Yes. Patient counseling was done. See below for items discussed. Discussed the patient's BMI. The BMI is in the acceptable range Follow up in one year.  Patient Counseling:   '[x]'     Nutrition: Stressed importance of moderation in sodium/caffeine intake, saturated fat and cholesterol, caloric balance, sufficient intake of fresh fruits, vegetables, fiber, calcium, iron, and 1 mg of folate supplement per day (for females capable of pregnancy).   '[x]'      Stressed the importance of regular exercise.    '[x]'     Substance Abuse: Discussed cessation/primary prevention of tobacco, alcohol, or other drug use; driving or other dangerous activities under the influence; availability of treatment for abuse.    '[x]'      Injury prevention: Discussed safety belts, safety helmets, smoke detector, smoking near bedding or upholstery.    '[x]'      Sexuality: Discussed sexually transmitted diseases, partner selection, use of condoms, avoidance of unintended pregnancy  and  contraceptive alternatives.    '[x]'     Dental health: Discussed importance of regular tooth brushing, flossing, and dental visits.   '[x]'      Health maintenance and immunizations reviewed. Please refer to Health maintenance section.   CMA or LPN served as scribe during this visit. History, Physical, and Plan performed by medical provider. The above documentation has been reviewed and is accurate and complete.  Inda Coke, PA-C [place]

## 2020-08-18 ENCOUNTER — Encounter: Payer: Self-pay | Admitting: Physician Assistant

## 2020-08-18 LAB — HEPATITIS C ANTIBODY
Hepatitis C Ab: NONREACTIVE
SIGNAL TO CUT-OFF: 0.02 (ref <1.00)

## 2020-08-18 LAB — CBC WITH DIFFERENTIAL/PLATELET
Absolute Monocytes: 291 cells/uL (ref 200–950)
Basophils Absolute: 71 cells/uL (ref 0–200)
Basophils Relative: 1.4 %
Eosinophils Absolute: 71 cells/uL (ref 15–500)
Eosinophils Relative: 1.4 %
HCT: 41.5 % (ref 35.0–45.0)
Hemoglobin: 13.8 g/dL (ref 11.7–15.5)
Lymphs Abs: 2193 cells/uL (ref 850–3900)
MCH: 30.9 pg (ref 27.0–33.0)
MCHC: 33.3 g/dL (ref 32.0–36.0)
MCV: 93 fL (ref 80.0–100.0)
MPV: 9.3 fL (ref 7.5–12.5)
Monocytes Relative: 5.7 %
Neutro Abs: 2474 cells/uL (ref 1500–7800)
Neutrophils Relative %: 48.5 %
Platelets: 271 10*3/uL (ref 140–400)
RBC: 4.46 10*6/uL (ref 3.80–5.10)
RDW: 12.3 % (ref 11.0–15.0)
Total Lymphocyte: 43 %
WBC: 5.1 10*3/uL (ref 3.8–10.8)

## 2020-08-18 LAB — LIPID PANEL
Cholesterol: 179 mg/dL (ref <200)
HDL: 84 mg/dL (ref >=50)
LDL Cholesterol (Calc): 86 mg/dL (calc)
Non-HDL Cholesterol (Calc): 95 mg/dL (calc) (ref <130)
Total CHOL/HDL Ratio: 2.1 (calc) (ref <5.0)
Triglycerides: 30 mg/dL (ref <150)

## 2020-08-18 LAB — COMPREHENSIVE METABOLIC PANEL
AG Ratio: 1.7 (calc) (ref 1.0–2.5)
ALT: 23 U/L (ref 6–29)
AST: 23 U/L (ref 10–30)
Albumin: 4.5 g/dL (ref 3.6–5.1)
Alkaline phosphatase (APISO): 40 U/L (ref 31–125)
BUN: 18 mg/dL (ref 7–25)
CO2: 27 mmol/L (ref 20–32)
Calcium: 9.6 mg/dL (ref 8.6–10.2)
Chloride: 103 mmol/L (ref 98–110)
Creat: 1.02 mg/dL (ref 0.50–1.10)
Globulin: 2.7 g/dL (calc) (ref 1.9–3.7)
Glucose, Bld: 88 mg/dL (ref 65–99)
Potassium: 4.1 mmol/L (ref 3.5–5.3)
Sodium: 137 mmol/L (ref 135–146)
Total Bilirubin: 0.7 mg/dL (ref 0.2–1.2)
Total Protein: 7.2 g/dL (ref 6.1–8.1)

## 2020-08-18 LAB — CYTOLOGY - PAP: Diagnosis: NEGATIVE

## 2020-08-25 MED FILL — CITALOPRAM HBR 10 MG TABLET: 10 | 30 days supply | Qty: 30 | Fill #1

## 2020-09-18 DIAGNOSIS — Z23 Encounter for immunization: Secondary | ICD-10-CM | POA: Diagnosis not present

## 2020-09-29 MED FILL — CITALOPRAM HBR 10 MG TABLET: 10 | 30 days supply | Qty: 30 | Fill #2

## 2020-10-19 DIAGNOSIS — E2839 Other primary ovarian failure: Secondary | ICD-10-CM | POA: Diagnosis not present

## 2020-10-19 DIAGNOSIS — Z319 Encounter for procreative management, unspecified: Secondary | ICD-10-CM | POA: Diagnosis not present

## 2020-11-05 MED FILL — CITALOPRAM HBR 10 MG TABLET: 10 | 30 days supply | Qty: 30 | Fill #3

## 2020-12-11 MED FILL — CITALOPRAM HBR 10 MG TABLET: 10 | 30 days supply | Qty: 30 | Fill #4

## 2021-02-22 MED FILL — CITALOPRAM HBR 10 MG TABLET: 10 | 30 days supply | Qty: 30 | Fill #5

## 2021-03-25 MED FILL — CITALOPRAM HBR 10 MG TABLET: 10 | 30 days supply | Qty: 30 | Fill #6

## 2021-04-17 DIAGNOSIS — M25571 Pain in right ankle and joints of right foot: Secondary | ICD-10-CM | POA: Diagnosis not present

## 2021-04-28 DIAGNOSIS — S99911A Unspecified injury of right ankle, initial encounter: Secondary | ICD-10-CM | POA: Diagnosis not present

## 2021-04-28 DIAGNOSIS — M25571 Pain in right ankle and joints of right foot: Secondary | ICD-10-CM | POA: Diagnosis not present

## 2021-05-05 DIAGNOSIS — F4323 Adjustment disorder with mixed anxiety and depressed mood: Secondary | ICD-10-CM | POA: Diagnosis not present

## 2021-05-07 ENCOUNTER — Other Ambulatory Visit (HOSPITAL_COMMUNITY): Payer: Self-pay

## 2021-05-07 MED FILL — Citalopram Hydrobromide Tab 10 MG (Base Equiv): ORAL | 90 days supply | Qty: 90 | Fill #0 | Status: AC

## 2021-05-12 DIAGNOSIS — F4323 Adjustment disorder with mixed anxiety and depressed mood: Secondary | ICD-10-CM | POA: Diagnosis not present

## 2021-05-19 DIAGNOSIS — F4323 Adjustment disorder with mixed anxiety and depressed mood: Secondary | ICD-10-CM | POA: Diagnosis not present

## 2021-05-27 DIAGNOSIS — F4323 Adjustment disorder with mixed anxiety and depressed mood: Secondary | ICD-10-CM | POA: Diagnosis not present

## 2021-06-02 DIAGNOSIS — F4323 Adjustment disorder with mixed anxiety and depressed mood: Secondary | ICD-10-CM | POA: Diagnosis not present

## 2021-06-07 DIAGNOSIS — Z319 Encounter for procreative management, unspecified: Secondary | ICD-10-CM | POA: Diagnosis not present

## 2021-06-07 DIAGNOSIS — E2839 Other primary ovarian failure: Secondary | ICD-10-CM | POA: Diagnosis not present

## 2021-06-10 DIAGNOSIS — F4323 Adjustment disorder with mixed anxiety and depressed mood: Secondary | ICD-10-CM | POA: Diagnosis not present

## 2021-06-23 DIAGNOSIS — F4323 Adjustment disorder with mixed anxiety and depressed mood: Secondary | ICD-10-CM | POA: Diagnosis not present

## 2021-06-30 ENCOUNTER — Other Ambulatory Visit (HOSPITAL_COMMUNITY): Payer: Self-pay

## 2021-06-30 DIAGNOSIS — E2839 Other primary ovarian failure: Secondary | ICD-10-CM | POA: Diagnosis not present

## 2021-06-30 MED ORDER — LEVOTHYROXINE SODIUM 25 MCG PO TABS
25.0000 ug | ORAL_TABLET | Freq: Every day | ORAL | 2 refills | Status: DC
  Filled 2021-06-30: qty 30, 30d supply, fill #0
  Filled 2021-07-30: qty 30, 30d supply, fill #1
  Filled 2021-09-03: qty 30, 30d supply, fill #2

## 2021-06-30 MED ORDER — ESTRADIOL 2 MG PO TABS
2.0000 mg | ORAL_TABLET | Freq: Two times a day (BID) | ORAL | 2 refills | Status: DC
  Filled 2021-06-30: qty 60, 30d supply, fill #0
  Filled 2021-08-05: qty 60, 30d supply, fill #1

## 2021-07-01 ENCOUNTER — Other Ambulatory Visit (HOSPITAL_COMMUNITY): Payer: Self-pay

## 2021-07-19 DIAGNOSIS — F4323 Adjustment disorder with mixed anxiety and depressed mood: Secondary | ICD-10-CM | POA: Diagnosis not present

## 2021-07-28 ENCOUNTER — Other Ambulatory Visit (HOSPITAL_COMMUNITY): Payer: Self-pay

## 2021-07-28 DIAGNOSIS — Z113 Encounter for screening for infections with a predominantly sexual mode of transmission: Secondary | ICD-10-CM | POA: Diagnosis not present

## 2021-07-28 MED ORDER — MEDROXYPROGESTERONE ACETATE 10 MG PO TABS
ORAL_TABLET | ORAL | 0 refills | Status: DC
  Filled 2021-07-28: qty 5, 5d supply, fill #0

## 2021-07-28 MED ORDER — DOXYCYCLINE HYCLATE 100 MG PO CAPS
100.0000 mg | ORAL_CAPSULE | Freq: Two times a day (BID) | ORAL | 0 refills | Status: DC
  Filled 2021-07-28: qty 10, 5d supply, fill #0

## 2021-07-29 ENCOUNTER — Other Ambulatory Visit (HOSPITAL_COMMUNITY): Payer: Self-pay

## 2021-07-30 ENCOUNTER — Other Ambulatory Visit (HOSPITAL_COMMUNITY): Payer: Self-pay

## 2021-08-05 ENCOUNTER — Other Ambulatory Visit (HOSPITAL_COMMUNITY): Payer: Self-pay

## 2021-09-02 ENCOUNTER — Other Ambulatory Visit (HOSPITAL_COMMUNITY): Payer: Self-pay

## 2021-09-02 MED ORDER — ESTRADIOL 0.1 MG/24HR TD PTTW
MEDICATED_PATCH | TRANSDERMAL | 4 refills | Status: DC
  Filled 2021-09-02: qty 8, 30d supply, fill #0

## 2021-09-02 MED ORDER — ESTRADIOL 2 MG PO TABS
ORAL_TABLET | ORAL | 3 refills | Status: DC
  Filled 2021-09-02: qty 60, 30d supply, fill #0
  Filled 2021-09-29: qty 60, 30d supply, fill #1

## 2021-09-03 ENCOUNTER — Other Ambulatory Visit (HOSPITAL_COMMUNITY): Payer: Self-pay

## 2021-09-17 ENCOUNTER — Other Ambulatory Visit (HOSPITAL_COMMUNITY): Payer: Self-pay

## 2021-09-17 MED ORDER — "BD LUER-LOK SYRINGE 18G X 1-1/2"" 3 ML MISC"
1 refills | Status: DC
  Filled 2021-09-17: qty 30, 30d supply, fill #0

## 2021-09-17 MED ORDER — ESTRADIOL 0.1 MG/24HR TD PTTW
MEDICATED_PATCH | TRANSDERMAL | 4 refills | Status: DC
  Filled 2021-09-17: qty 8, 28d supply, fill #0

## 2021-09-17 MED ORDER — "EASY TOUCH HYPODERMIC NEEDLE 22G X 1-1/2"" MISC"
1 refills | Status: DC
  Filled 2021-09-17: qty 30, 30d supply, fill #0

## 2021-09-17 MED ORDER — PROGESTERONE 50 MG/ML IM OIL
TOPICAL_OIL | INTRAMUSCULAR | 1 refills | Status: DC
  Filled 2021-09-17: qty 30, 30d supply, fill #0

## 2021-09-20 ENCOUNTER — Other Ambulatory Visit (HOSPITAL_COMMUNITY): Payer: Self-pay

## 2021-09-29 ENCOUNTER — Other Ambulatory Visit (HOSPITAL_COMMUNITY): Payer: Self-pay

## 2021-09-29 ENCOUNTER — Other Ambulatory Visit: Payer: Self-pay | Admitting: Physician Assistant

## 2021-09-30 ENCOUNTER — Other Ambulatory Visit (HOSPITAL_COMMUNITY): Payer: Self-pay

## 2021-09-30 MED ORDER — LEVOTHYROXINE SODIUM 25 MCG PO TABS
ORAL_TABLET | Freq: Every day | ORAL | 3 refills | Status: DC
  Filled 2021-09-30: qty 30, 30d supply, fill #0
  Filled 2021-10-20: qty 30, 30d supply, fill #1

## 2021-10-01 ENCOUNTER — Other Ambulatory Visit (HOSPITAL_COMMUNITY): Payer: Self-pay

## 2021-10-02 ENCOUNTER — Other Ambulatory Visit (HOSPITAL_COMMUNITY): Payer: Self-pay

## 2021-10-21 ENCOUNTER — Other Ambulatory Visit (HOSPITAL_COMMUNITY): Payer: Self-pay

## 2021-10-22 ENCOUNTER — Other Ambulatory Visit (HOSPITAL_COMMUNITY): Payer: Self-pay

## 2021-10-22 MED ORDER — PROGESTERONE 200 MG PO CAPS
200.0000 mg | ORAL_CAPSULE | Freq: Every evening | ORAL | 0 refills | Status: DC
Start: 2021-10-18 — End: 2022-04-11
  Filled 2021-10-22: qty 14, 14d supply, fill #0

## 2021-10-23 ENCOUNTER — Other Ambulatory Visit (HOSPITAL_COMMUNITY): Payer: Self-pay

## 2021-10-28 LAB — OB RESULTS CONSOLE GC/CHLAMYDIA
Chlamydia: NEGATIVE
Neisseria Gonorrhea: NEGATIVE

## 2021-10-28 LAB — OB RESULTS CONSOLE RUBELLA ANTIBODY, IGM: Rubella: IMMUNE

## 2021-10-28 LAB — OB RESULTS CONSOLE ABO/RH: RH Type: POSITIVE

## 2021-10-28 LAB — OB RESULTS CONSOLE ANTIBODY SCREEN: Antibody Screen: NEGATIVE

## 2021-10-28 LAB — OB RESULTS CONSOLE HEPATITIS B SURFACE ANTIGEN: Hepatitis B Surface Ag: NEGATIVE

## 2021-10-28 LAB — OB RESULTS CONSOLE HIV ANTIBODY (ROUTINE TESTING): HIV: NONREACTIVE

## 2021-10-28 LAB — HEPATITIS C ANTIBODY: HCV Ab: NEGATIVE

## 2021-11-15 ENCOUNTER — Other Ambulatory Visit (HOSPITAL_COMMUNITY): Payer: Self-pay

## 2021-11-15 MED ORDER — LEVOTHYROXINE SODIUM 25 MCG PO TABS
ORAL_TABLET | ORAL | 2 refills | Status: DC
  Filled 2021-11-15: qty 60, 46d supply, fill #0

## 2021-12-21 ENCOUNTER — Other Ambulatory Visit (HOSPITAL_COMMUNITY): Payer: Self-pay

## 2021-12-21 MED ORDER — LEVOTHYROXINE SODIUM 50 MCG PO TABS
ORAL_TABLET | ORAL | 1 refills | Status: DC
  Filled 2021-12-21: qty 30, 30d supply, fill #0
  Filled 2022-01-31: qty 30, 30d supply, fill #1

## 2021-12-26 NOTE — L&D Delivery Note (Signed)
Delivery Note   Pt progressed to complete dilation and pushed great for about 30 minuted.  At 12:55 PM a healthy female was delivered via Vaginal, Spontaneous (Presentation:  LOA    ).  APGAR: 9, 9; weight  pending.   Placenta status: Spontaneous, Intact.  Cord: 3 vessels with the following complications:  Nuchal x 1 reduced.   Anesthesia: Epidural Episiotomy: None Lacerations:  First degree vaginal and periurethral Suture Repair: 3.0 vicryl rapide Est. Blood Loss (mL):   Mom to postpartum.  Baby to Couplet care / Skin to Skin.  D/w parents circumcision and they desire  Melanie Keller 05/27/2022, 1:22 PM

## 2022-01-31 ENCOUNTER — Other Ambulatory Visit (HOSPITAL_COMMUNITY): Payer: Self-pay

## 2022-03-11 ENCOUNTER — Other Ambulatory Visit (HOSPITAL_COMMUNITY): Payer: Self-pay

## 2022-03-11 MED ORDER — LEVOTHYROXINE SODIUM 50 MCG PO TABS
ORAL_TABLET | ORAL | 3 refills | Status: AC
  Filled 2022-03-16: qty 30, 30d supply, fill #0
  Filled 2022-04-26: qty 30, 30d supply, fill #1
  Filled 2022-05-25: qty 30, 30d supply, fill #2
  Filled 2022-07-01: qty 30, 30d supply, fill #3

## 2022-03-16 ENCOUNTER — Other Ambulatory Visit (HOSPITAL_COMMUNITY): Payer: Self-pay

## 2022-04-10 ENCOUNTER — Inpatient Hospital Stay (HOSPITAL_COMMUNITY)
Admission: AD | Admit: 2022-04-10 | Discharge: 2022-04-11 | Disposition: A | Discharge status: Home / Self Care | Payer: No Typology Code available for payment source | Attending: Obstetrics and Gynecology | Admitting: Obstetrics and Gynecology

## 2022-04-10 ENCOUNTER — Encounter (HOSPITAL_COMMUNITY): Payer: Self-pay | Admitting: Obstetrics and Gynecology

## 2022-04-10 ENCOUNTER — Other Ambulatory Visit: Payer: Self-pay

## 2022-04-10 DIAGNOSIS — Z3A33 33 weeks gestation of pregnancy: Secondary | ICD-10-CM | POA: Insufficient documentation

## 2022-04-10 DIAGNOSIS — O4703 False labor before 37 completed weeks of gestation, third trimester: Secondary | ICD-10-CM

## 2022-04-11 ENCOUNTER — Inpatient Hospital Stay (EMERGENCY_DEPARTMENT_HOSPITAL)
Admission: AD | Admit: 2022-04-11 | Discharge: 2022-04-11 | Disposition: A | Discharge status: Home / Self Care | Payer: No Typology Code available for payment source | Source: Home / Self Care | Attending: Obstetrics and Gynecology | Admitting: Obstetrics and Gynecology

## 2022-04-11 ENCOUNTER — Other Ambulatory Visit (HOSPITAL_COMMUNITY): Payer: Self-pay

## 2022-04-11 ENCOUNTER — Encounter (HOSPITAL_COMMUNITY): Payer: Self-pay | Admitting: Obstetrics and Gynecology

## 2022-04-11 DIAGNOSIS — Z3A33 33 weeks gestation of pregnancy: Secondary | ICD-10-CM

## 2022-04-11 DIAGNOSIS — O4703 False labor before 37 completed weeks of gestation, third trimester: Secondary | ICD-10-CM

## 2022-04-11 LAB — WET PREP, GENITAL
Clue Cells Wet Prep HPF POC: NONE SEEN
Sperm: NONE SEEN
Trich, Wet Prep: NONE SEEN
WBC, Wet Prep HPF POC: >=10 — AB (ref <10)
Yeast Wet Prep HPF POC: NONE SEEN

## 2022-04-11 LAB — URINALYSIS, ROUTINE W REFLEX MICROSCOPIC
Bilirubin Urine: NEGATIVE
Glucose, UA: NEGATIVE mg/dL
Hgb urine dipstick: NEGATIVE
Ketones, ur: NEGATIVE mg/dL
Leukocytes,Ua: NEGATIVE
Nitrite: NEGATIVE
Protein, ur: NEGATIVE mg/dL
Specific Gravity, Urine: 1.003 — ABNORMAL LOW (ref 1.005–1.030)
pH: 7 (ref 5.0–8.0)

## 2022-04-11 MED ORDER — LACTATED RINGERS IV BOLUS
1000.0000 mL | Freq: Once | INTRAVENOUS | Status: AC
  Administered 2022-04-11: 1000 mL via INTRAVENOUS

## 2022-04-11 MED ORDER — NIFEDIPINE 10 MG PO CAPS
10.0000 mg | ORAL_CAPSULE | Freq: Once | ORAL | Status: AC
  Administered 2022-04-11: 10 mg via ORAL
  Filled 2022-04-11: qty 1

## 2022-04-11 MED ORDER — NIFEDIPINE 10 MG PO CAPS
10.0000 mg | ORAL_CAPSULE | ORAL | Status: DC | PRN
  Filled 2022-04-11: qty 1

## 2022-04-11 MED ORDER — NIFEDIPINE ER OSMOTIC RELEASE 30 MG PO TB24
30.0000 mg | ORAL_TABLET | Freq: Every day | ORAL | 2 refills | Status: DC | PRN
  Filled 2022-04-11: qty 30, 20d supply, fill #0

## 2022-04-11 NOTE — MAU Provider Note (Signed)
?History  ?  ? ?CSN: [id number] ? ?Arrival date and time: 04/10/22 2323 ? ? Event Date/Time  ? First Provider Initiated Contact with Patient 04/11/22 0021   ?  ? ?Chief Complaint  ?Patient presents with  ? Contractions  ? ?HPI ?Melanie Keller is a 31 y.o. G1P0 at 51w2dwho presents with possible contractions. She reports she has been feeling braxton hicks contractions for "a while" but tonight they became more regular. She reports she noticed they were every 4-5 minutes so she was told by the on call MD to come in. She reports the contractions are not painful but she noticed that they were more frequent. She denies any bleeding or leaking. She reports normal fetal movement. She denies any issues in the pregnancy.  ? ?OB History   ? ? Gravida  ?1  ? Para  ?   ? Term  ?   ? Preterm  ?   ? AB  ?   ? Living  ?   ?  ? ? SAB  ?   ? IAB  ?   ? Ectopic  ?   ? Multiple  ?   ? Live Births  ?   ?   ?  ?  ? ? ?History reviewed. No pertinent past medical history. ? ?Past Surgical History:  ?Procedure Laterality Date  ? TYMPANOSTOMY TUBE PLACEMENT Bilateral   ? ? ?Family History  ?Problem Relation Age of Onset  ? Arthritis Mother   ? Fibromyalgia Mother   ? Early menopause Mother 384 ? Cancer Father   ?     prostate cancer  ? Cancer Maternal Grandmother 463 ?     breast cancer with brain Met  ? Early menopause Maternal Grandmother 359 ? Cancer Maternal Aunt 554 ?     colon cancer  ? ? ?Social History  ? ?Tobacco Use  ? Smoking status: Never  ? Smokeless tobacco: Never  ?Vaping Use  ? Vaping Use: Never used  ?Substance Use Topics  ? Alcohol use: Not Currently  ?  Alcohol/week: 2.0 standard drinks  ?  Types: 2 Cans of beer per week  ?  Comment: a week  ? Drug use: No  ? ? ?Allergies: No Known Allergies ? ?Medications Prior to Admission  ?Medication Sig Dispense Refill Last Dose  ? levothyroxine (SYNTHROID) 50 MCG tablet Take 1 tablet by mouth in the morning on an empty stomach 30 tablet 3 04/10/2022  ? prenatal vitamin w/FE, FA  (PRENATAL 1 + 1) 27-1 MG TABS tablet Take 1 tablet by mouth daily at 12 noon.   04/10/2022  ? citalopram (CELEXA) 10 MG tablet TAKE 1 TABLET BY MOUTH ONCE DAILY 90 tablet 3   ? doxycycline (VIBRAMYCIN) 100 MG capsule Take 1 capsule (100 mg total) by mouth 2 (two) times daily. 10 capsule 0   ? estradiol (ESTRACE) 2 MG tablet Take 1 tablet by mouth 2 times daily. 60 tablet 2   ? estradiol (ESTRACE) 2 MG tablet Take 1 tablet by mouth twice a day 60 tablet 3   ? estradiol (VIVELLE-DOT) 0.1 MG/24HR patch Apply 1 patch every 3 days as directed 8 patch 4   ? estradiol (VIVELLE-DOT) 0.1 MG/24HR patch Apply 1 patch every 3 days as directed 8 patch 4   ? levothyroxine (SYNTHROID) 25 MCG tablet Take 1 tablet by mouth daily 30 tablet 3   ? levothyroxine (SYNTHROID) 25 MCG tablet Take 1 tablet by mouth daily Monday-Friday and  take 1 tablet twice a day on Saturday and Sunday 60 tablet 2   ? medroxyPROGESTERone (PROVERA) 10 MG tablet Take 1 tablet by mouth once daily for 5 days starting on 08/05/21 5 tablet 0   ? NEEDLE, DISP, 22 G (EASY TOUCH HYPODERMIC NEEDLE) 22G X 1-1/2" MISC Use to inject progesterone 30 each 1   ? norgestimate-ethinyl estradiol (SPRINTEC 28) 0.25-35 MG-MCG tablet Take 1 tablet by mouth daily. (Patient not taking: Reported on 08/17/2020) 3 Package 4   ? Norgestimate-Ethinyl Estradiol Triphasic (TRI-SPRINTEC) 0.18/0.215/0.25 MG-35 MCG tablet Take 1 tablet by mouth daily. (Patient not taking: Reported on 05/11/2020) 1 Package 11   ? progesterone (PROMETRIUM) 200 MG capsule Take 1 capsule (200 mg total) by mouth at bedtime. 14 capsule 0   ? progesterone 50 MG/ML injection Inject 1 ml into the muscle daily 30 mL 1   ? SYRINGE-NEEDLE, DISP, 3 ML (B-D 3CC LUER-LOK SYR 18GX1-1/2) 18G X 1-1/2" 3 ML MISC Use with Progesterone 30 each 1   ? ? ?Review of Systems  ?Constitutional: Negative.  Negative for fatigue and fever.  ?HENT: Negative.    ?Respiratory: Negative.  Negative for shortness of breath.   ?Cardiovascular:  Negative.  Negative for chest pain.  ?Gastrointestinal:  Positive for abdominal pain. Negative for constipation, diarrhea, nausea and vomiting.  ?Genitourinary: Negative.  Negative for dysuria, vaginal bleeding and vaginal discharge.  ?Neurological: Negative.  Negative for dizziness and headaches.  ?Physical Exam  ? ?Blood pressure 114/78, pulse 60, temperature 98.2 ?F (36.8 ?C), temperature source Oral, resp. rate 18, SpO2 98 %. ? ?Physical Exam ?Vitals and nursing note reviewed.  ?Constitutional:   ?   General: She is not in acute distress. ?   Appearance: She is well-developed.  ?HENT:  ?   Head: Normocephalic.  ?Eyes:  ?   Pupils: Pupils are equal, round, and reactive to light.  ?Cardiovascular:  ?   Rate and Rhythm: Normal rate and regular rhythm.  ?   Heart sounds: Normal heart sounds.  ?Pulmonary:  ?   Effort: Pulmonary effort is normal. No respiratory distress.  ?   Breath sounds: Normal breath sounds.  ?Abdominal:  ?   General: Bowel sounds are normal. There is no distension.  ?   Palpations: Abdomen is soft.  ?   Tenderness: There is no abdominal tenderness.  ?Skin: ?   General: Skin is warm and dry.  ?Neurological:  ?   Mental Status: She is alert and oriented to person, place, and time.  ?Psychiatric:     ?   Mood and Affect: Mood normal.     ?   Behavior: Behavior normal.     ?   Thought Content: Thought content normal.     ?   Judgment: Judgment normal.  ? ?Fetal Tracing: ? ?Baseline: 120 ?Variability: moderate ?Accels: 15x15 ?Decels: none ? ?Toco: 2-4 ? ?Cervix: closed/thick/posterior ? ?MAU Course  ?Procedures ?Results for orders placed or performed during the hospital encounter of 04/10/22 (from the past 24 hour(s))  ?Urinalysis, Routine w reflex microscopic Urine, Clean Catch     Status: Abnormal  ? Collection Time: 04/11/22 12:20 AM  ?Result Value Ref Range  ? Color, Urine STRAW (A) YELLOW  ? APPearance CLEAR CLEAR  ? Specific Gravity, Urine 1.003 (L) 1.005 - 1.030  ? pH 7.0 5.0 - 8.0  ? Glucose,  UA NEGATIVE NEGATIVE mg/dL  ? Hgb urine dipstick NEGATIVE NEGATIVE  ? Bilirubin Urine NEGATIVE NEGATIVE  ? Ketones, ur NEGATIVE  NEGATIVE mg/dL  ? Protein, ur NEGATIVE NEGATIVE mg/dL  ? Nitrite NEGATIVE NEGATIVE  ? Leukocytes,Ua NEGATIVE NEGATIVE  ?  ?MDM ?Prenatal records from private office reviewed. Pregnancy uncomplicated. ?Labs ordered and reviewed. ? ?UA ?LR bolus ? ?Patient reports feeling like contractions have spaced since IV fluids. Contractions on monitoring now 6-10 minutes apart. CNM discussed toco findings with patient and partner. Options for tocolysis reviewed including procardia and terbutaline. Risks and benefits of each reviewed at length. Patient states she is feeling better and would prefer to not do tocolysis at this time. She desires discharge home with strict return precautions.  ? ?Assessment and Plan  ? ?1. Threatened preterm labor, third trimester   ?2. [redacted] weeks gestation of pregnancy   ? ?-Discharge home in stable condition ?-Preterm labor precautions discussed ?-Patient advised to follow-up with OB as scheduled on Thursday for prenatal care ?-Patient may return to MAU as needed or if her condition were to change or worsen ? ? ?Wende Mott CNM ?04/11/2022, 12:21 AM  ?

## 2022-04-11 NOTE — MAU Note (Signed)
Melanie Keller is a 31 y.o. at [redacted]w[redacted]d here in MAU reporting: she has been having braxton hicks ctx for a while but her ctx got more consistent tonight. Reports ctx are about every 5 minutes. Called on call doctor and was told to come in. Denies VB or LOF. +FM  ? ?Pain score: 4 ?Vitals:  ? 04/11/22 0007  ?BP: 128/81  ?Pulse: 64  ?Resp: 18  ?Temp: 98.2 ?F (36.8 ?C)  ?   ?FHT:130 ?Lab orders placed from triage: u/a  ? ?

## 2022-04-11 NOTE — Discharge Instructions (Signed)

## 2022-04-11 NOTE — MAU Note (Signed)
Linaya Hession is a 31 y.o. at [redacted]w[redacted]d here in MAU reporting: was seen last night for contractions, states was not having pain last night. Today contractions are every 5 min and she is feeling some pain. No bleeding. +FM. Some clear discharge this morning. ? ?Onset of complaint: ongoing since Friday ? ?Pain score: 2/10 ? ?Vitals:  ? 04/11/22 1034  ?BP: (!) 138/107  ?Pulse: 83  ?Resp: 16  ?Temp: 97.9 ?F (36.6 ?C)  ?SpO2: 97%  ?   ?FHT:EFM applied in room ? ?Lab orders placed from triage: UA ? ?

## 2022-04-11 NOTE — MAU Provider Note (Signed)
?History  ?  ? ?CSN: [id number] ? ?Arrival date and time: 04/11/22 1021 ? ? Event Date/Time  ? First Provider Initiated Contact with Patient 04/11/22 1053   ?  ? ?Chief Complaint  ?Patient presents with  ? Contractions  ? Rupture of Membranes  ? ?HPI ?This is a 31 year old G1, P0 at 33 weeks and 2 days who presents with contractions that started last night.  Patient has been more active over the weekend than normal as she and her wife placed a [place] drain over the weekend.  She noticed the contractions last night which were frequent, but not painful.  They seem to space out with IV fluids.  This morning she noticed that the contractions were more painful when she started to get ready for work.  She noted the contractions about every 4 to 5 minutes and are mildly painful.  Due to the irregular nature of the contractions, she came into the hospital to be evaluated.  She does notice clear vaginal discharge. ? ?OB History   ? ? Gravida  ?1  ? Para  ?   ? Term  ?   ? Preterm  ?   ? AB  ?   ? Living  ?   ?  ? ? SAB  ?   ? IAB  ?   ? Ectopic  ?   ? Multiple  ?   ? Live Births  ?   ?   ?  ?  ? ? ?History reviewed. No pertinent past medical history. ? ?Past Surgical History:  ?Procedure Laterality Date  ? TYMPANOSTOMY TUBE PLACEMENT Bilateral   ? ? ?Family History  ?Problem Relation Age of Onset  ? Arthritis Mother   ? Fibromyalgia Mother   ? Early menopause Mother 64  ? Cancer Father   ?     prostate cancer  ? Cancer Maternal Grandmother 60  ?     breast cancer with brain Met  ? Early menopause Maternal Grandmother 54  ? Cancer Maternal Aunt 91  ?     colon cancer  ? ? ?Social History  ? ?Tobacco Use  ? Smoking status: Never  ? Smokeless tobacco: Never  ?Vaping Use  ? Vaping Use: Never used  ?Substance Use Topics  ? Alcohol use: Not Currently  ?  Alcohol/week: 2.0 standard drinks  ?  Types: 2 Cans of beer per week  ?  Comment: a week  ? Drug use: No  ? ? ?Allergies: No Known Allergies ? ?No medications prior to admission.   ? ? ?Review of Systems ?Physical Exam  ? ?Blood pressure 113/72, pulse 84, temperature 97.9 ?F (36.6 ?C), temperature source Oral, resp. rate 16, height '5\' 6"'  (1.676 m), weight 82.2 kg, SpO2 97 %. ? ?Physical Exam ?Vitals reviewed. Exam conducted with a chaperone present.  ?Constitutional:   ?   Appearance: Normal appearance.  ?Pulmonary:  ?   Effort: Pulmonary effort is normal.  ?Abdominal:  ?   General: Abdomen is flat.  ?   Palpations: Abdomen is soft.  ?   Hernia: There is no hernia in the left inguinal area or right inguinal area.  ?Genitourinary: ?   Labia:     ?   Right: No rash or tenderness.     ?   Left: No rash or tenderness.   ?Lymphadenopathy:  ?   Lower Body: No right inguinal adenopathy. No left inguinal adenopathy.  ?Skin: ?   General: Skin is warm and dry.  ?  Capillary Refill: Capillary refill takes less than 2 seconds.  ?Neurological:  ?   General: No focal deficit present.  ?   Mental Status: She is alert.  ?Psychiatric:     ?   Mood and Affect: Mood normal.     ?   Behavior: Behavior normal.     ?   Thought Content: Thought content normal.     ?   Judgment: Judgment normal.  ? ? ?MAU Course  ?Procedures ?NST:  ?Baseline: 130  ?Variability: moderate ?Accelerations: ++  ?Decelerations: None ?Contractions: initially every 4-5 minutes. None at end ? ?MDM ? Improved after IV fluid bolus, Nifedipine 65m IR x2. ? ?Assessment and Plan  ? ?1. [redacted] weeks gestation of pregnancy   ?2. Threatened preterm labor, third trimester   ? ?Discharged to home with procardia XR 333m ?Return precautions given. ? ?JaTruett Mainland4/17/2023, 1:12 PM  ?

## 2022-04-27 ENCOUNTER — Other Ambulatory Visit (HOSPITAL_COMMUNITY): Payer: Self-pay

## 2022-05-05 LAB — OB RESULTS CONSOLE GBS: GBS: NEGATIVE

## 2022-05-24 ENCOUNTER — Encounter (HOSPITAL_COMMUNITY): Payer: Self-pay

## 2022-05-24 ENCOUNTER — Telehealth (HOSPITAL_COMMUNITY): Payer: Self-pay | Admitting: *Deleted

## 2022-05-24 NOTE — Telephone Encounter (Signed)
Preadmission screen  

## 2022-05-25 ENCOUNTER — Other Ambulatory Visit (HOSPITAL_COMMUNITY): Payer: Self-pay

## 2022-05-25 ENCOUNTER — Telehealth (HOSPITAL_COMMUNITY): Payer: Self-pay | Admitting: *Deleted

## 2022-05-25 ENCOUNTER — Encounter (HOSPITAL_COMMUNITY): Payer: Self-pay | Admitting: *Deleted

## 2022-05-25 NOTE — Telephone Encounter (Signed)
Preadmission screen  

## 2022-05-26 ENCOUNTER — Other Ambulatory Visit: Payer: Self-pay

## 2022-05-26 ENCOUNTER — Other Ambulatory Visit: Payer: Self-pay | Admitting: Obstetrics and Gynecology

## 2022-05-26 DIAGNOSIS — Z3490 Encounter for supervision of normal pregnancy, unspecified, unspecified trimester: Secondary | ICD-10-CM

## 2022-05-26 NOTE — H&P (Signed)
Francine Hannan is a 31 y.o. female G1P0 at 52 6/7 weeks (EDD 05/28/22 by known [facility] with IVF embryo transfer) presenting for IOL at term.  Prenatal care significant for: 1) Euthyroid with thyroid antibodies  TPO Ab 100,  Endocrine following synthroid Labs q trimester WNL  2) Low lying placenta  Resolved  3) IVF - in-vitro fertilization pregnancy  single FET, same-sex partner, neg genetics Fetal echo WNL  4) Primary ovarian failure  dx at 45  OB History     Gravida  1   Para      Term      Preterm      AB      Living         SAB      IAB      Ectopic      Multiple      Live Births             No past medical history on file. Past Surgical History:  Procedure Laterality Date   TYMPANOSTOMY TUBE PLACEMENT Bilateral    Family History: family history includes Arthritis in her mother; Cancer in her father; Cancer (age of onset: 34) in her maternal grandmother; Cancer (age of onset: 28) in her maternal aunt; Early menopause (age of onset: 26) in her maternal grandmother and mother; Fibromyalgia in her mother. Social History:  reports that she has never smoked. She has never used smokeless tobacco. She reports that she does not currently use alcohol after a past usage of about 2.0 standard drinks per week. She reports that she does not use drugs.     Maternal Diabetes: No Genetic Screening: Normal Maternal Ultrasounds/Referrals: Normal Fetal Ultrasounds or other Referrals:  Fetal echo Maternal Substance Abuse:  No Significant Maternal Medications:  None Significant Maternal Lab Results:  Group B Strep negative Other Comments:  None  Review of Systems  Constitutional:  Negative for fever.  Gastrointestinal:  Negative for abdominal pain.  Genitourinary:  Negative for vaginal bleeding.  Maternal Medical History:  Contractions: Frequency: irregular.   Perceived severity is mild.   Fetal activity: Perceived fetal activity is normal.   Prenatal  complications: IVF pregnancy Prenatal Complications - Diabetes: none.    There were no vitals taken for this visit. Maternal Exam:  Uterine Assessment: Contraction strength is mild.  Contraction frequency is irregular.  Abdomen: Patient reports no abdominal tenderness. Fetal presentation: vertex Introitus: Normal vulva. Normal vagina.   Physical Exam Constitutional:      Appearance: Normal appearance.  Cardiovascular:     Rate and Rhythm: Normal rate and regular rhythm.  Pulmonary:     Effort: Pulmonary effort is normal.  Abdominal:     Palpations: Abdomen is soft.  Genitourinary:    General: Normal vulva.  Neurological:     General: No focal deficit present.     Mental Status: She is alert.  Psychiatric:        Mood and Affect: Mood normal.        Behavior: Behavior normal.    Prenatal labs: ABO, Rh: A/Positive/-- (11/03 0000) Antibody: Negative (11/03 0000) Rubella: Immune (11/03 0000) RPR:   Non reactive HBsAg: Negative (11/03 0000)  HIV: Non-reactive (11/03 0000)  GBS: Negative/-- (05/11 0000)  One hour GCT 64 Carrier screen of partner (EGG donor) WNL HgbAA Assessment/Plan: Plan ripening and IOL at term.  Will start with cytotec and transition to foley bulb or pitocin as needed.   Oliver Pila 05/26/2022, 8:22 PM

## 2022-05-27 ENCOUNTER — Inpatient Hospital Stay (HOSPITAL_COMMUNITY): Payer: No Typology Code available for payment source | Admitting: Anesthesiology

## 2022-05-27 ENCOUNTER — Other Ambulatory Visit: Payer: Self-pay

## 2022-05-27 ENCOUNTER — Inpatient Hospital Stay (HOSPITAL_COMMUNITY)
Admission: AD | Admit: 2022-05-27 | Discharge: 2022-05-28 | DRG: 807 | Disposition: A | Discharge status: Home / Self Care | Payer: No Typology Code available for payment source | Attending: Obstetrics and Gynecology | Admitting: Obstetrics and Gynecology

## 2022-05-27 ENCOUNTER — Inpatient Hospital Stay (HOSPITAL_COMMUNITY): Payer: No Typology Code available for payment source

## 2022-05-27 ENCOUNTER — Encounter (HOSPITAL_COMMUNITY): Payer: Self-pay | Admitting: Obstetrics and Gynecology

## 2022-05-27 DIAGNOSIS — Z3490 Encounter for supervision of normal pregnancy, unspecified, unspecified trimester: Secondary | ICD-10-CM

## 2022-05-27 DIAGNOSIS — Z3A39 39 weeks gestation of pregnancy: Secondary | ICD-10-CM

## 2022-05-27 DIAGNOSIS — O26893 Other specified pregnancy related conditions, third trimester: Secondary | ICD-10-CM | POA: Diagnosis present

## 2022-05-27 LAB — CBC
HCT: 36.7 % (ref 36.0–46.0)
Hemoglobin: 13 g/dL (ref 12.0–15.0)
MCH: 31.6 pg (ref 26.0–34.0)
MCHC: 35.4 g/dL (ref 30.0–36.0)
MCV: 89.1 fL (ref 80.0–100.0)
Platelets: 228 10*3/uL (ref 150–400)
RBC: 4.12 MIL/uL (ref 3.87–5.11)
RDW: 12.6 % (ref 11.5–15.5)
WBC: 12.1 10*3/uL — ABNORMAL HIGH (ref 4.0–10.5)
nRBC: 0 % (ref 0.0–0.2)

## 2022-05-27 LAB — RPR: RPR Ser Ql: NONREACTIVE

## 2022-05-27 LAB — TYPE AND SCREEN
ABO/RH(D): A POS
Antibody Screen: NEGATIVE

## 2022-05-27 MED ORDER — SIMETHICONE 80 MG PO CHEW: 80.0000 mg | CHEWABLE_TABLET | ORAL | Status: DC | PRN

## 2022-05-27 MED ORDER — ZOLPIDEM TARTRATE 5 MG PO TABS: 5.0000 mg | ORAL_TABLET | Freq: Every evening | ORAL | Status: DC | PRN

## 2022-05-27 MED ORDER — DIBUCAINE (PERIANAL) 1 % EX OINT: 1.0000 "application " | TOPICAL_OINTMENT | CUTANEOUS | Status: DC | PRN

## 2022-05-27 MED ORDER — LACTATED RINGERS IV SOLN: INTRAVENOUS | Status: DC

## 2022-05-27 MED ORDER — DIPHENHYDRAMINE HCL 50 MG/ML IJ SOLN: 12.5000 mg | INTRAMUSCULAR | Status: DC | PRN

## 2022-05-27 MED ORDER — DIPHENHYDRAMINE HCL 25 MG PO CAPS: 25.0000 mg | ORAL_CAPSULE | Freq: Four times a day (QID) | ORAL | Status: DC | PRN

## 2022-05-27 MED ORDER — LIDOCAINE HCL (PF) 1 % IJ SOLN
30.0000 mL | INTRAMUSCULAR | Status: DC | PRN
Start: 2022-05-27 — End: 2022-05-27

## 2022-05-27 MED ORDER — PHENYLEPHRINE 80 MCG/ML (10ML) SYRINGE FOR IV PUSH (FOR BLOOD PRESSURE SUPPORT): 80.0000 ug | PREFILLED_SYRINGE | INTRAVENOUS | Status: DC | PRN

## 2022-05-27 MED ORDER — ONDANSETRON HCL 4 MG/2ML IJ SOLN: 4.0000 mg | Freq: Four times a day (QID) | INTRAMUSCULAR | Status: DC | PRN

## 2022-05-27 MED ORDER — TETANUS-DIPHTH-ACELL PERTUSSIS 5-2.5-18.5 LF-MCG/0.5 IM SUSY: 0.5000 mL | PREFILLED_SYRINGE | Freq: Once | INTRAMUSCULAR | Status: DC

## 2022-05-27 MED ORDER — LIDOCAINE HCL (PF) 1 % IJ SOLN
INTRAMUSCULAR | Status: DC | PRN
  Administered 2022-05-27: 5 mL via EPIDURAL

## 2022-05-27 MED ORDER — EPHEDRINE 5 MG/ML INJ
10.0000 mg | INTRAVENOUS | Status: DC | PRN
Start: 2022-05-27 — End: 2022-05-27

## 2022-05-27 MED ORDER — LACTATED RINGERS IV SOLN: 500.0000 mL | Freq: Once | INTRAVENOUS | Status: DC

## 2022-05-27 MED ORDER — COCONUT OIL OIL: 1.0000 "application " | TOPICAL_OIL | Status: DC | PRN

## 2022-05-27 MED ORDER — ACETAMINOPHEN 325 MG PO TABS
650.0000 mg | ORAL_TABLET | ORAL | Status: DC | PRN
  Filled 2022-05-27: qty 2

## 2022-05-27 MED ORDER — OXYTOCIN BOLUS FROM INFUSION
333.0000 mL | Freq: Once | INTRAVENOUS | Status: AC
  Administered 2022-05-27: 333 mL via INTRAVENOUS

## 2022-05-27 MED ORDER — SOD CITRATE-CITRIC ACID 500-334 MG/5ML PO SOLN: 30.0000 mL | ORAL | Status: DC | PRN

## 2022-05-27 MED ORDER — OXYCODONE-ACETAMINOPHEN 5-325 MG PO TABS: 2.0000 | ORAL_TABLET | ORAL | Status: DC | PRN

## 2022-05-27 MED ORDER — ONDANSETRON HCL 4 MG/2ML IJ SOLN: 4.0000 mg | INTRAMUSCULAR | Status: DC | PRN

## 2022-05-27 MED ORDER — TERBUTALINE SULFATE 1 MG/ML IJ SOLN: 0.2500 mg | Freq: Once | INTRAMUSCULAR | Status: DC | PRN

## 2022-05-27 MED ORDER — OXYTOCIN-SODIUM CHLORIDE 30-0.9 UT/500ML-% IV SOLN
1.0000 m[IU]/min | INTRAVENOUS | Status: DC
  Administered 2022-05-27: 2 m[IU]/min via INTRAVENOUS

## 2022-05-27 MED ORDER — BENZOCAINE-MENTHOL 20-0.5 % EX AERO: 1.0000 "application " | INHALATION_SPRAY | CUTANEOUS | Status: DC | PRN

## 2022-05-27 MED ORDER — LACTATED RINGERS IV SOLN: 500.0000 mL | INTRAVENOUS | Status: DC | PRN

## 2022-05-27 MED ORDER — EPHEDRINE 5 MG/ML INJ: 10.0000 mg | INTRAVENOUS | Status: DC | PRN

## 2022-05-27 MED ORDER — ACETAMINOPHEN 325 MG PO TABS: 650.0000 mg | ORAL_TABLET | ORAL | Status: DC | PRN

## 2022-05-27 MED ORDER — LEVOTHYROXINE SODIUM 50 MCG PO TABS
50.0000 ug | ORAL_TABLET | Freq: Every day | ORAL | Status: DC
  Filled 2022-05-27: qty 1

## 2022-05-27 MED ORDER — PHENYLEPHRINE 80 MCG/ML (10ML) SYRINGE FOR IV PUSH (FOR BLOOD PRESSURE SUPPORT)
80.0000 ug | PREFILLED_SYRINGE | INTRAVENOUS | Status: DC | PRN
  Filled 2022-05-27: qty 10

## 2022-05-27 MED ORDER — MISOPROSTOL 25 MCG QUARTER TABLET
25.0000 ug | ORAL_TABLET | ORAL | Status: DC
  Administered 2022-05-27: 25 ug via VAGINAL
  Filled 2022-05-27: qty 1

## 2022-05-27 MED ORDER — LEVOTHYROXINE SODIUM 50 MCG PO TABS
50.0000 ug | ORAL_TABLET | Freq: Every day | ORAL | Status: DC
  Administered 2022-05-28: 50 ug via ORAL
  Filled 2022-05-27 (×2): qty 1

## 2022-05-27 MED ORDER — OXYTOCIN-SODIUM CHLORIDE 30-0.9 UT/500ML-% IV SOLN
2.5000 [IU]/h | INTRAVENOUS | Status: DC
  Administered 2022-05-27: 2.5 [IU]/h via INTRAVENOUS
  Filled 2022-05-27: qty 500

## 2022-05-27 MED ORDER — FENTANYL CITRATE (PF) 100 MCG/2ML IJ SOLN: 50.0000 ug | INTRAMUSCULAR | Status: DC | PRN

## 2022-05-27 MED ORDER — IBUPROFEN 600 MG PO TABS
600.0000 mg | ORAL_TABLET | Freq: Four times a day (QID) | ORAL | Status: DC
  Administered 2022-05-27 – 2022-05-28 (×4): 600 mg via ORAL
  Filled 2022-05-27 (×4): qty 1

## 2022-05-27 MED ORDER — OXYCODONE-ACETAMINOPHEN 5-325 MG PO TABS: 1.0000 | ORAL_TABLET | ORAL | Status: DC | PRN

## 2022-05-27 MED ORDER — SENNOSIDES-DOCUSATE SODIUM 8.6-50 MG PO TABS
2.0000 | ORAL_TABLET | ORAL | Status: DC
  Administered 2022-05-28: 2 via ORAL
  Filled 2022-05-27: qty 2

## 2022-05-27 MED ORDER — ONDANSETRON HCL 4 MG PO TABS: 4.0000 mg | ORAL_TABLET | ORAL | Status: DC | PRN

## 2022-05-27 MED ORDER — FENTANYL-BUPIVACAINE-NACL 0.5-0.125-0.9 MG/250ML-% EP SOLN
EPIDURAL | Status: DC | PRN
  Administered 2022-05-27: 12 mL/h via EPIDURAL

## 2022-05-27 MED ORDER — FENTANYL-BUPIVACAINE-NACL 0.5-0.125-0.9 MG/250ML-% EP SOLN
12.0000 mL/h | EPIDURAL | Status: DC | PRN
  Filled 2022-05-27: qty 250

## 2022-05-27 MED ORDER — TERBUTALINE SULFATE 1 MG/ML IJ SOLN
0.2500 mg | Freq: Once | INTRAMUSCULAR | Status: DC | PRN
Start: 2022-05-27 — End: 2022-05-27

## 2022-05-27 MED ORDER — PRENATAL MULTIVITAMIN CH
1.0000 | ORAL_TABLET | Freq: Every day | ORAL | Status: DC
  Administered 2022-05-28: 1 via ORAL
  Filled 2022-05-27: qty 1

## 2022-05-27 MED ORDER — WITCH HAZEL-GLYCERIN EX PADS: 1.0000 "application " | MEDICATED_PAD | CUTANEOUS | Status: DC | PRN

## 2022-05-27 NOTE — Lactation Note (Signed)
This note was copied from a baby's chart. Lactation Consultation Note  Patient Name: Boy Laquia Wimpy [id number] Date: 05/27/2022   Age:31 hours   Initial L&D Consult:  Per RN, mother would like some family time and declines lactation services at this time.  RN will call for assistance if mother desires.  If not, we will follow up on the M/B unit.   Maternal Data    Feeding    LATCH Score                    Lactation Tools Discussed/Used    Interventions    Discharge    Consult Status      Devlyn Parish R Jermain Curt 05/27/2022, 1:33 PM

## 2022-05-27 NOTE — Progress Notes (Signed)
Patient ID: Melanie Keller, female   DOB: [birth year], 31 y.o.   MRN: [id number] Pt received cytotec x 1 and began to uncomfortably labor with SROM at about 0730am.  She then received an epidural and is now comfortable  Afeb VSS  FHR category 1  Cervix on RN exam 1-2/c/0 at 9am  Low dose pitocin started and will follow progress

## 2022-05-27 NOTE — Anesthesia Preprocedure Evaluation (Addendum)
Anesthesia Evaluation  Patient identified by MRN, date of birth, ID band Patient awake    Reviewed: Allergy & Precautions, Patient's Chart, lab work & pertinent test results  Airway Mallampati: II  TM Distance: >3 FB Neck ROM: Full    Dental no notable dental hx. (+) Teeth Intact, Dental Advisory Given   Pulmonary neg pulmonary ROS,    Pulmonary exam normal breath sounds clear to auscultation       Cardiovascular Exercise Tolerance: Good Normal cardiovascular exam Rhythm:Regular Rate:Normal     Neuro/Psych negative neurological ROS  negative psych ROS   GI/Hepatic negative GI ROS, Neg liver ROS,   Endo/Other  negative endocrine ROS  Renal/GU negative Renal ROS     Musculoskeletal   Abdominal   Peds  Hematology Lab Results      Component                Value               Date                      WBC                      12.1 (H)            05/27/2022                HGB                      13.0                05/27/2022                HCT                      36.7                05/27/2022                MCV                      89.1                05/27/2022                PLT                      228                 05/27/2022              Anesthesia Other Findings   Reproductive/Obstetrics (+) Pregnancy                             Anesthesia Physical Anesthesia Plan  ASA: 2  Anesthesia Plan: Epidural   Post-op Pain Management:    Induction:   PONV Risk Score and Plan:   Airway Management Planned:   Additional Equipment: None  Intra-op Plan:   Post-operative Plan:   Informed Consent: I have reviewed the patients History and Physical, chart, labs and discussed the procedure including the risks, benefits and alternatives for the proposed anesthesia with the patient or authorized representative who has indicated his/her understanding and acceptance.       Plan  Discussed with: CRNA  Anesthesia Plan Comments: (39.6 wk primagravida for LEA)  Anesthesia Quick Evaluation  

## 2022-05-27 NOTE — Anesthesia Procedure Notes (Signed)
Epidural Patient location during procedure: OB Start time: 05/27/2022 8:38 AM End time: 05/27/2022 8:53 AM  Staffing Anesthesiologist: Trevor Iha, MD Performed: anesthesiologist   Preanesthetic Checklist Completed: patient identified, IV checked, site marked, risks and benefits discussed, surgical consent, monitors and equipment checked, pre-op evaluation and timeout performed  Epidural Patient position: sitting Prep: DuraPrep and site prepped and draped Patient monitoring: continuous pulse ox and blood pressure Approach: midline Location: L2-L3 Injection technique: LOR air  Needle:  Needle type: Tuohy  Needle gauge: 17 G Needle length: 9 cm and 9 Needle insertion depth: 6 cm Catheter type: closed end flexible Catheter size: 19 Gauge Catheter at skin depth: 12 cm Test dose: negative  Assessment Events: blood not aspirated, injection not painful, no injection resistance, no paresthesia and negative IV test  Additional Notes Patient identified. Risks/Benefits/Options discussed with patient including but not limited to bleeding, infection, nerve damage, paralysis, failed block, incomplete pain control, headache, blood pressure changes, nausea, vomiting, reactions to medication both or allergic, itching and postpartum back pain. Confirmed with bedside nurse the patient's most recent platelet count. Confirmed with patient that they are not currently taking any anticoagulation, have any bleeding history or any family history of bleeding disorders. Patient expressed understanding and wished to proceed. All questions were answered. Sterile technique was used throughout the entire procedure. Please see nursing notes for vital signs. Test dose was given through epidural needle and negative prior to continuing to dose epidural or start infusion. Warning signs of high block given to the patient including shortness of breath, tingling/numbness in hands, complete motor block, or any concerning  symptoms with instructions to call for help. Patient was given instructions on fall risk and not to get out of bed. All questions and concerns addressed with instructions to call with any issues.  1 Attempt (S) . Patient tolerated procedure well.

## 2022-05-27 NOTE — Progress Notes (Signed)
Patient ID: Melanie Keller, female   DOB: Apr 28, 1991, 31 y.o.   MRN: [id number] Pt had a prolonged decel to 80-90 but overall strip has been category 1 with occasional variable decelerations  Afeb vss  Cervix c/8/0  FSE applied and pitocin decreased to 41mu as appeared slightly hyperstimulated.   FHR now back to 140-150 with mild decelerations with contractions Will follow closely

## 2022-05-27 NOTE — Lactation Note (Signed)
This note was copied from a baby's chart. Lactation Consultation Note Mom unable to latch at this time although mom stated he latched well after delivery but has been sleepy since. Newborn feeding habits reviewed. Mom has inverted nipples. LC used hand pump for pre-pumping to see if would evert. Nipples would come up to appear flat then sink back to invert. At this time nipples are very compressible. LC attempted to latch using t-cup hold. Baby had to work hard to keep nipple in mouth. If LC released breast tissue latch would be lost. LC fitted mom w/#20 NS. Baby latched right away and BF well for 30 minutes seeing colostrum and spit in NS. Mom excited. Stressed importance of wearing shells that RN brought to mom for am. Discussed mom pumping q3hrs for stimulation to evert nipples. Mom has colostrum so encouraged mom to give back colostrum that is pumped in bottle after BF. Mom encouraged to feed baby 8-12 times/24 hours and with feeding cues.  Mom shown how to use DEBP & how to disassemble, clean, & reassemble parts. Mom knows to pump q3h for 15-20 min. Mom taught how to apply & clean nipple shield.  Mom encouraged to do skin-to-skin.  Baby satisfied after feeding. Mom started using DEBP after BF. LC answered questions mom had.  Feeding plan: Pre-pump to define nipple boarders w/hand pump, Apply #20 NS and Latch in football hold for now. Post-pump w/DEBP Significant other can bottle feed colostrum that has been pumped from previous feeding. Wear shells during the day.  Call for assistance or questions.  Patient Name: Melanie Keller AYTKZ'[id number] Date: 05/27/2022 Reason for consult: Initial assessment;Primapara;Term Age:49 hours  Maternal Data Has patient been taught Hand Expression?: Yes Does the patient have breastfeeding experience prior to this delivery?: No  Feeding    LATCH Score Latch: Grasps breast easily, tongue down, lips flanged, rhythmical sucking.  Audible  Swallowing: A few with stimulation  Type of Nipple: Inverted  Comfort (Breast/Nipple): Soft / non-tender  Hold (Positioning): Assistance needed to correctly position infant at breast and maintain latch.  LATCH Score: 6   Lactation Tools Discussed/Used Tools: Pump;Shells;Nipple Shields Nipple shield size: 20 Breast pump type: Double-Electric Breast Pump Pump Education: Setup, frequency, and cleaning;Milk Storage Reason for Pumping: inverted nipples/NS Pumping frequency: q3hr  Interventions Interventions: Breast feeding basics reviewed;Assisted with latch;Skin to skin;Breast massage;Hand express;Pre-pump if needed;Breast compression;Adjust position;Support pillows;Position options;Shells;DEBP;LC Services brochure  Discharge    Consult Status Consult Status: Follow-up Date: 05/28/22 Follow-up type: In-patient    Charyl Dancer 05/27/2022, 10:57 PM

## 2022-05-27 NOTE — Lactation Note (Signed)
This note was copied from a baby's chart. Lactation Consultation Note  Patient Name: Melanie Keller EGBTD'V Date: 05/27/2022 Reason for consult: L&D Initial assessment;Term Age:31 hours  Initial L&D Consult:  RN requested assistance.  Assisted to latch easily; with stimulation baby was able to begin sucking.  Observed him feeding on/off for 5 minutes prior to leaving the room.  Reassured family that lactation services will be provided on the M/B unit.  Allowed time for continued bonding.  2 support people present.   Maternal Data    Feeding    LATCH Score Latch: Repeated attempts needed to sustain latch, nipple held in mouth throughout feeding, stimulation needed to elicit sucking reflex.  Audible Swallowing: None  Type of Nipple: Flat (Breast tissue compressible)  Comfort (Breast/Nipple): Soft / non-tender  Hold (Positioning): Assistance needed to correctly position infant at breast and maintain latch.  LATCH Score: 5   Lactation Tools Discussed/Used    Interventions Interventions: Assisted with latch;Skin to skin  Discharge    Consult Status Consult Status: Follow-up from L&D    Melanie Keller 05/27/2022, 1:57 PM

## 2022-05-27 NOTE — H&P (Signed)
Melanie Keller is a 31 y.o. female G1P0 at 97 6/7 weeks (EDD 05/28/22 by known [place] with IVF embryo transfer) presenting for IOL at term.  Prenatal care significant for: 1) Euthyroid with thyroid antibodies  TPO Ab 100,  Endocrine following synthroid 74mcg Labs q trimester WNL  2) Low lying placenta  Resolved  3) IVF - in-vitro fertilization pregnancy  single FET, same-sex partner, neg genetics Fetal echo WNL  4) Primary ovarian failure  dx at 87  OB History     Gravida  1   Para      Term      Preterm      AB      Living         SAB      IAB      Ectopic      Multiple      Live Births             History reviewed. No pertinent past medical history. Past Surgical History:  Procedure Laterality Date   TYMPANOSTOMY TUBE PLACEMENT Bilateral    Family History: family history includes Arthritis in her mother; Cancer in her father; Cancer (age of onset: 72) in her maternal grandmother; Cancer (age of onset: 24) in her maternal aunt; Early menopause (age of onset: 71) in her maternal grandmother and mother; Fibromyalgia in her mother. Social History:  reports that she has never smoked. She has never used smokeless tobacco. She reports that she does not currently use alcohol after a past usage of about 2.0 standard drinks per week. She reports that she does not use drugs.     Maternal Diabetes: No Genetic Screening: Normal Maternal Ultrasounds/Referrals: Normal Fetal Ultrasounds or other Referrals:  Fetal echo Maternal Substance Abuse:  No Significant Maternal Medications:  None Significant Maternal Lab Results:  Group B Strep negative Other Comments:  None  Review of Systems  Constitutional:  Negative for fever.  Gastrointestinal:  Negative for abdominal pain.  Genitourinary:  Negative for vaginal bleeding.  Maternal Medical History:  Contractions: Frequency: irregular.   Perceived severity is mild.   Fetal activity: Perceived fetal activity is  normal.   Prenatal complications: IVF pregnancy Prenatal Complications - Diabetes: none.  Dilation: 1.5 Effacement (%): 50 Station: -2 Exam by:: Consuela Mimes, RN Blood pressure 128/79, pulse (!) 52, temperature 98 F (36.7 C), temperature source Oral, resp. rate 17, height 5\' 6"  (1.676 m), weight 81.7 kg. Maternal Exam:  Uterine Assessment: Contraction strength is mild.  Contraction frequency is irregular.  Abdomen: Patient reports no abdominal tenderness. Fetal presentation: vertex Introitus: Normal vulva. Normal vagina.   Physical Exam Constitutional:      Appearance: Normal appearance.  Cardiovascular:     Rate and Rhythm: Normal rate and regular rhythm.  Pulmonary:     Effort: Pulmonary effort is normal.  Abdominal:     Palpations: Abdomen is soft.  Genitourinary:    General: Normal vulva.  Neurological:     General: No focal deficit present.     Mental Status: She is alert.  Psychiatric:        Mood and Affect: Mood normal.        Behavior: Behavior normal.    Prenatal labs: ABO, Rh: --/--/A POS (06/02 0049) Antibody: NEG (06/02 0049) Rubella: Immune (11/03 0000) RPR:   Non reactive HBsAg: Negative (11/03 0000)  HIV: Non-reactive (11/03 0000)  GBS: Negative/-- (05/11 0000)  One hour GCT 64 Carrier screen of partner (EGG donor) WNL HgbAA Assessment/Plan:  Plan ripening and IOL at term.  Will start with cytotec and transition to foley bulb or pitocin as needed.   Logan Bores 05/27/2022, 6:37 AM

## 2022-05-28 LAB — CBC
HCT: 33.8 % — ABNORMAL LOW (ref 36.0–46.0)
Hemoglobin: 11.6 g/dL — ABNORMAL LOW (ref 12.0–15.0)
MCH: 31.2 pg (ref 26.0–34.0)
MCHC: 34.3 g/dL (ref 30.0–36.0)
MCV: 90.9 fL (ref 80.0–100.0)
Platelets: 205 10*3/uL (ref 150–400)
RBC: 3.72 MIL/uL — ABNORMAL LOW (ref 3.87–5.11)
RDW: 13.1 % (ref 11.5–15.5)
WBC: 15.8 10*3/uL — ABNORMAL HIGH (ref 4.0–10.5)
nRBC: 0 % (ref 0.0–0.2)

## 2022-05-28 NOTE — Progress Notes (Signed)
Patient is eating, ambulating, voiding.  Pain control is good.  Vitals:   05/27/22 1614 05/27/22 1900 05/27/22 2344 05/28/22 0406  BP: 117/74 113/69 113/74 111/68  Pulse: 64 (!) 57 (!) 18 (!) 55  Resp: 18 18 18 18   Temp: 98.4 F (36.9 C) 98.2 F (36.8 C) 97.9 F (36.6 C) (!) 97.5 F (36.4 C)  TempSrc: Oral  Oral Oral  SpO2:      Weight:      Height:        Fundus firm Perineum without swelling.  Lab Results  Component Value Date   WBC 15.8 (H) 05/28/2022   HGB 11.6 (L) 05/28/2022   HCT 33.8 (L) 05/28/2022   MCV 90.9 05/28/2022   PLT 205 05/28/2022    --/--/A POS (06/02 0049)/RI  A/P Post partum day 1.  Routine care.  Expect d/c routine.  Nursing note indicates baby needs to feed better- will do circ tomorrow.  12-16-1981

## 2022-05-28 NOTE — Lactation Note (Signed)
This note was copied from a baby's chart. Lactation Consultation Note  Patient Name: Melanie Keller UMPNT'I Date: 05/28/2022 Reason for consult: Follow-up assessment;Mother's request;Difficult latch;Term;Breastfeeding assistance Age:31 hours  LC assisted with latching at the breast with signs of milk transfer without use of NS for the first time at discharge.  Mom continue to use breast shells not pumping sleeping or nursing.  Mom EBF for now and if she can latch without NS she can suspend pumping.   Mom aware if she needs NS can affect milk supply and she will need to post pump after latching for 15 min on initial setting with personal Spectra pump.   All questions answered at the end of the visit.   Maternal Data Has patient been taught Hand Expression?: Yes  Feeding Mother's Current Feeding Choice: Breast Milk  LATCH Score Latch: Repeated attempts needed to sustain latch, nipple held in mouth throughout feeding, stimulation needed to elicit sucking reflex.  Audible Swallowing: Spontaneous and intermittent  Type of Nipple: Everted at rest and after stimulation  Comfort (Breast/Nipple): Soft / non-tender  Hold (Positioning): Assistance needed to correctly position infant at breast and maintain latch.  LATCH Score: 8   Lactation Tools Discussed/Used Tools: Pump;Flanges;Nipple Shields Nipple shield size: 20 Flange Size: 24 Breast pump type: Double-Electric Breast Pump Pump Education: Setup, frequency, and cleaning;Milk Storage Reason for Pumping: increase stimulation Pumping frequency: post pump after latching for 15 min on initial setting.  Interventions Interventions: Breast feeding basics reviewed;Skin to skin;Assisted with latch;Breast massage;Hand express;Breast compression;Adjust position;Support pillows;Position options;Expressed milk;DEBP;Education;LC Services brochure;Infant Driven Feeding Algorithm education  Discharge Discharge Education: Engorgement  and breast care;Warning signs for feeding baby;Outpatient recommendation;Outpatient Epic message sent Pump: DEBP;Personal  Consult Status Consult Status: Complete Date: 05/28/22 Follow-up type: In-patient    [name]  Nicholson-Springer 05/28/2022, 2:25 PM

## 2022-05-28 NOTE — Lactation Note (Signed)
This note was copied from a baby's chart. Lactation Consultation Note  Patient Name: Melanie Keller [id number] Date: 05/28/2022   Age:31 hours  Infant resting at the time of [facility] visit. Parents to call for latch assistance with next feeding.   Maternal Data    Feeding    LATCH Score                    Lactation Tools Discussed/Used    Interventions    Discharge    Consult Status      Envy Meno  Nicholson-Springer 05/28/2022, 1:36 PM

## 2022-05-28 NOTE — Discharge Summary (Signed)
Postpartum Discharge Summary  Date of Service updated      Patient Name: Melanie Keller DOB: March 15, 1991 MRN: [id number]  Date of admission: 05/27/2022 Delivery date:05/27/2022  Delivering provider: Paula Compton  Date of discharge: 05/28/2022  Admitting diagnosis: Indication for care in labor and delivery, antepartum [O75.9] NSVD (normal spontaneous vaginal delivery) [O80] Intrauterine pregnancy: [redacted]w[redacted]d    Secondary diagnosis:  Principal Problem:   Indication for care in labor and delivery, antepartum Active Problems:   NSVD (normal spontaneous vaginal delivery)  Additional problems: none    Discharge diagnosis: Term Pregnancy Delivered                                              Post partum procedures: none Augmentation: AROM, Pitocin, Cytotec, and IP Foley Complications: None  Hospital course: Induction of Labor With Vaginal Delivery   31y.o. yo G1P1001 at 372w6das admitted to the hospital 05/27/2022 for induction of labor.  Indication for induction: IVF  Patient had an uncomplicated labor course as follows: Membrane Rupture Time/Date: 7:42 AM ,05/27/2022   Delivery Method:Vaginal, Spontaneous  Episiotomy: None  Lacerations:  1st degree  Details of delivery can be found in separate delivery note.  Patient had a routine postpartum course. Patient is discharged home 05/28/22.  Newborn Data: Birth date:05/27/2022  Birth time:12:55 PM  Gender:Female  Living status:Living  Apgars:9 ,9  Weight:3402 g   Magnesium Sulfate received: No BMZ received: No Rhophylac:No MMR:No T-DaP:Given prenatally Flu: No Transfusion:No  Physical exam  Vitals:   05/27/22 1614 05/27/22 1900 05/27/22 2344 05/28/22 0406  BP: 117/74 113/69 113/74 111/68  Pulse: 64 (!) 57 (!) 18 (!) 55  Resp: _0 Temp: 98.4 F (36.9 C) 98.2 F (36.8 C) 97.9 F (36.6 C) (!) 97.5 F (36.4 C)  TempSrc: Oral  Oral Oral  SpO2:      Weight:      Height:       Labs: Lab Results  Component  Value Date   WBC 15.8 (H) 05/28/2022   HGB 11.6 (L) 05/28/2022   HCT 33.8 (L) 05/28/2022   MCV 90.9 05/28/2022   PLT 205 05/28/2022      Latest Ref Rng & Units 08/17/2020   12:18 PM  CMP  Glucose 65 - 99 mg/dL 88    BUN 7 - 25 mg/dL 18    Creatinine 0.50 - 1.10 mg/dL 1.02    Sodium 135 - 146 mmol/L 137    Potassium 3.5 - 5.3 mmol/L 4.1    Chloride 98 - 110 mmol/L 103    CO2 20 - 32 mmol/L 27    Calcium 8.6 - 10.2 mg/dL 9.6    Total Protein 6.1 - 8.1 g/dL 7.2    Total Bilirubin 0.2 - 1.2 mg/dL 0.7    AST 10 - 30 U/L 23    ALT 6 - 29 U/L 23     Edinburgh Score:    05/28/2022   12:15 PM  Edinburgh Postnatal Depression Scale Screening Tool  I have been able to laugh and see the funny side of things. 0  I have looked forward with enjoyment to things. 0  I have blamed myself unnecessarily when things went wrong. 0  I have been anxious or worried for no good reason. 0  I have felt scared or panicky for no good  reason. 0  Things have been getting on top of me. 0  I have been so unhappy that I have had difficulty sleeping. 0  I have felt sad or miserable. 0  I have been so unhappy that I have been crying. 0  The thought of harming myself has occurred to me. 0  Edinburgh Postnatal Depression Scale Total 0      After visit meds:  Allergies as of 05/28/2022   No Known Allergies      Medication List     STOP taking these medications    NIFEdipine 30 MG 24 hr tablet Commonly known as: PROCARDIA-XL/NIFEDICAL-XL       TAKE these medications    citalopram 10 MG tablet Commonly known as: CELEXA TAKE 1 TABLET BY MOUTH ONCE DAILY   levothyroxine 25 MCG tablet Commonly known as: SYNTHROID Take 1 tablet by mouth daily   levothyroxine 25 MCG tablet Commonly known as: Synthroid Take 1 tablet by mouth daily Monday-Friday and take 1 tablet twice a day on Saturday and Sunday   levothyroxine 50 MCG tablet Commonly known as: Synthroid Take 1 tablet by mouth in the morning  on an empty stomach               Discharge Care Instructions  (From admission, onward)           Start     Ordered   05/28/22 0000  Discharge wound care:       Comments: Sitz baths and icepacks to perineum.  If stitches, they will dissolve.   05/28/22 1444             Discharge home in stable condition Infant Feeding:  ? Infant Disposition:home with mother Discharge instruction: per After Visit Summary and Postpartum booklet. Activity: Advance as tolerated. Pelvic rest for 6 weeks.  Diet: routine diet Anticipated Birth Control: Unsure Postpartum Appointment:6 weeks Additional Postpartum F/U:  none Future Appointments:No future appointments. Follow up Visit:  Follow-up Information     Paula Compton, MD Follow up in 6 day(s).   Specialty: Obstetrics and Gynecology Contact information: [address] [place] [address] [phone]                     05/28/2022 Daria Pastures, MD

## 2022-05-28 NOTE — Progress Notes (Signed)
Baby ok to be circumcised and parents want to go home.  All risks d/w them.

## 2022-05-28 NOTE — Anesthesia Postprocedure Evaluation (Signed)
Anesthesia Post Note  Patient: Dispensing optician  Procedure(s) Performed: AN AD [place]     Patient location during evaluation: Mother Baby Anesthesia Type: Epidural Level of consciousness: awake and alert Pain management: pain level controlled Vital Signs Assessment: post-procedure vital signs reviewed and stable Respiratory status: spontaneous breathing, nonlabored ventilation and respiratory function stable Cardiovascular status: stable Postop Assessment: no headache, no backache and epidural receding Anesthetic complications: no   No notable events documented.  Last Vitals:  Vitals:   05/27/22 2344 05/28/22 0406  BP: 113/74 111/68  Pulse: (!) 18 (!) 55  Resp: 18 18  Temp: 36.6 C (!) 36.4 C  SpO2:      Last Pain:  Vitals:   05/28/22 0406  TempSrc: Oral  PainSc:    Pain Goal:                   Stefani Dama

## 2022-06-06 ENCOUNTER — Telehealth (HOSPITAL_COMMUNITY): Payer: Self-pay | Admitting: *Deleted

## 2022-06-06 NOTE — Telephone Encounter (Signed)
Attempted hospital discharge follow-up call. Left message for patient to return RN call. Deforest Hoyles, RN, 06/06/22, [phone]

## 2022-07-01 ENCOUNTER — Other Ambulatory Visit (HOSPITAL_COMMUNITY): Payer: Self-pay

## 2022-07-02 ENCOUNTER — Other Ambulatory Visit (HOSPITAL_COMMUNITY): Payer: Self-pay

## 2022-09-19 ENCOUNTER — Encounter: Payer: Self-pay | Admitting: *Deleted

## 2022-09-21 ENCOUNTER — Other Ambulatory Visit (HOSPITAL_COMMUNITY): Payer: Self-pay

## 2022-09-21 MED ORDER — ONDANSETRON 4 MG PO TBDP
ORAL_TABLET | ORAL | 1 refills | Status: DC
  Filled 2022-09-21: qty 12, 3d supply, fill #0

## 2022-09-22 ENCOUNTER — Other Ambulatory Visit (HOSPITAL_COMMUNITY): Payer: Self-pay

## 2022-12-08 ENCOUNTER — Encounter: Payer: Self-pay | Admitting: *Deleted

## 2023-02-01 ENCOUNTER — Other Ambulatory Visit: Payer: Self-pay | Admitting: Obstetrics and Gynecology

## 2023-02-01 DIAGNOSIS — E2839 Other primary ovarian failure: Secondary | ICD-10-CM

## 2023-03-08 ENCOUNTER — Other Ambulatory Visit (HOSPITAL_COMMUNITY): Payer: Self-pay

## 2023-03-08 MED ORDER — AMOXICILLIN 500 MG PO CAPS
500.0000 mg | ORAL_CAPSULE | Freq: Three times a day (TID) | ORAL | 0 refills | Status: DC
  Filled 2023-03-08: qty 21, 7d supply, fill #0

## 2023-04-28 ENCOUNTER — Emergency Department (HOSPITAL_BASED_OUTPATIENT_CLINIC_OR_DEPARTMENT_OTHER)
Admission: EM | Admit: 2023-04-28 | Discharge: 2023-04-28 | Disposition: A | Discharge status: Home / Self Care | Payer: No Typology Code available for payment source | Attending: Emergency Medicine | Admitting: Emergency Medicine

## 2023-04-28 ENCOUNTER — Encounter (HOSPITAL_BASED_OUTPATIENT_CLINIC_OR_DEPARTMENT_OTHER): Payer: Self-pay

## 2023-04-28 ENCOUNTER — Other Ambulatory Visit: Payer: Self-pay

## 2023-04-28 DIAGNOSIS — R109 Unspecified abdominal pain: Secondary | ICD-10-CM | POA: Diagnosis present

## 2023-04-28 DIAGNOSIS — B349 Viral infection, unspecified: Secondary | ICD-10-CM | POA: Diagnosis not present

## 2023-04-28 DIAGNOSIS — Z1152 Encounter for screening for COVID-19: Secondary | ICD-10-CM | POA: Diagnosis not present

## 2023-04-28 LAB — COMPREHENSIVE METABOLIC PANEL
ALT: 19 U/L (ref 0–44)
AST: 21 U/L (ref 15–41)
Albumin: 3.8 g/dL (ref 3.5–5.0)
Alkaline Phosphatase: 42 U/L (ref 38–126)
Anion gap: 7 (ref 5–15)
BUN: 13 mg/dL (ref 6–20)
CO2: 23 mmol/L (ref 22–32)
Calcium: 8.5 mg/dL — ABNORMAL LOW (ref 8.9–10.3)
Chloride: 106 mmol/L (ref 98–111)
Creatinine, Ser: 1.01 mg/dL — ABNORMAL HIGH (ref 0.44–1.00)
GFR, Estimated: >60 mL/min (ref >60)
Glucose, Bld: 105 mg/dL — ABNORMAL HIGH (ref 70–99)
Potassium: 3.9 mmol/L (ref 3.5–5.1)
Sodium: 136 mmol/L (ref 135–145)
Total Bilirubin: 0.6 mg/dL (ref 0.3–1.2)
Total Protein: 7.1 g/dL (ref 6.5–8.1)

## 2023-04-28 LAB — CBC
HCT: 37.9 % (ref 36.0–46.0)
Hemoglobin: 12.8 g/dL (ref 12.0–15.0)
MCH: 29 pg (ref 26.0–34.0)
MCHC: 33.8 g/dL (ref 30.0–36.0)
MCV: 85.9 fL (ref 80.0–100.0)
Platelets: 224 10*3/uL (ref 150–400)
RBC: 4.41 MIL/uL (ref 3.87–5.11)
RDW: 12.9 % (ref 11.5–15.5)
WBC: 7.8 10*3/uL (ref 4.0–10.5)
nRBC: 0 % (ref 0.0–0.2)

## 2023-04-28 LAB — URINALYSIS, ROUTINE W REFLEX MICROSCOPIC
Bilirubin Urine: NEGATIVE
Glucose, UA: NEGATIVE mg/dL
Hgb urine dipstick: NEGATIVE
Ketones, ur: NEGATIVE mg/dL
Leukocytes,Ua: NEGATIVE
Nitrite: NEGATIVE
Protein, ur: NEGATIVE mg/dL
Specific Gravity, Urine: 1.01 (ref 1.005–1.030)
pH: 6.5 (ref 5.0–8.0)

## 2023-04-28 LAB — SARS CORONAVIRUS 2 BY RT PCR: SARS Coronavirus 2 by RT PCR: NEGATIVE

## 2023-04-28 LAB — PREGNANCY, URINE: Preg Test, Ur: NEGATIVE

## 2023-04-28 LAB — LIPASE, BLOOD: Lipase: 34 U/L (ref 11–51)

## 2023-04-28 MED ORDER — SODIUM CHLORIDE 0.9 % IV BOLUS
1000.0000 mL | Freq: Once | INTRAVENOUS | Status: AC
  Administered 2023-04-28: 1000 mL via INTRAVENOUS

## 2023-04-28 NOTE — Discharge Instructions (Signed)
You were evaluated in the Emergency Department and after careful evaluation, we did not find any emergent condition requiring admission or further testing in the hospital.  Your exam/testing today is overall reassuring.  Symptoms likely due to a viral illness.  Continue Tylenol or Motrin at home for discomfort, plenty of fluids and rest.  Please return to the Emergency Department if you experience any worsening of your condition.   Thank you for allowing [place] to be a part of your care.

## 2023-04-28 NOTE — ED Triage Notes (Signed)
Fever x 5 days ranging from 100.2 - 102. Had been taking OTC ibuprofen and tylenol. Last dose ~3am.   Yesterday began experiencing generalized abdominal pains and distention. Pain significant with pressure applied.   Denies urinary sx.

## 2023-04-28 NOTE — ED Provider Notes (Signed)
[facility] Emergency Department Provider Note MRN:  [id number]  Arrival date & time: 04/28/23     Chief Complaint   Abdominal Pain   History of Present Illness   Melanie Keller is a 32 y.o. year-old female with no pertinent past medical history presenting to the ED with chief complaint of abdominal pain.  Fever, malaise, body aches, abdominal discomfort and bloating, also with headaches this evening.  No cough or URI symptoms.  No chest pain or shortness of breath.  No burning with urination.  Review of Systems  A thorough review of systems was obtained and all systems are negative except as noted in the HPI and PMH.   Patient's Health History   History reviewed. No pertinent past medical history.  Past Surgical History:  Procedure Laterality Date   TYMPANOSTOMY TUBE PLACEMENT Bilateral     Family History  Problem Relation Age of Onset   Arthritis Mother    Fibromyalgia Mother    Early menopause Mother 71   Cancer Father        prostate cancer   Cancer Maternal Grandmother 8       breast cancer with brain Met   Early menopause Maternal Grandmother 58   Cancer Maternal Aunt 61       colon cancer    Social History   Socioeconomic History   Marital status: Married    Spouse name: Lauren   Number of children: Not on file   Years of education: Not on file   Highest education level: Not on file  Occupational History   Not on file  Tobacco Use   Smoking status: Never   Smokeless tobacco: Never  Vaping Use   Vaping Use: Never used  Substance and Sexual Activity   Alcohol use: Not Currently    Alcohol/week: 2.0 standard drinks of alcohol    Types: 2 Cans of beer per week    Comment: a week   Drug use: No   Sexual activity: Yes    Partners: Female  Other Topics Concern   Not on file  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   Financial Resource Strain: Not on file  Food Insecurity: Not  on file  Transportation Needs: Not on file  Physical Activity: Not on file  Stress: Not on file  Social Connections: Not on file  Intimate Partner Violence: Not on file     Physical Exam   Vitals:   04/28/23 0420  BP: 131/81  Pulse: 81  Resp: 17  Temp: 98.8 F (37.1 C)  SpO2: 95%    CONSTITUTIONAL: Well-appearing, NAD NEURO/PSYCH:  Alert and oriented x 3, no focal deficits, no meningismus EYES:  eyes equal and reactive ENT/NECK:  no LAD, no JVD CARDIO: Regular rate, well-perfused, normal S1 and S2 PULM:  CTAB no wheezing or rhonchi GI/GU:  non-distended, non-tender MSK/SPINE:  No gross deformities, no edema SKIN:  no rash, atraumatic   *Additional and/or pertinent findings included in MDM below  Diagnostic and Interventional Summary    EKG Interpretation  Date/Time:    Ventricular Rate:    PR Interval:    QRS Duration:   QT Interval:    QTC Calculation:   R Axis:     Text Interpretation:         Labs Reviewed  COMPREHENSIVE METABOLIC PANEL - Abnormal; Notable for the following components:      Result Value   Glucose, Bld 105 (*)  Creatinine, Ser 1.01 (*)    Calcium 8.5 (*)    All other components within normal limits  SARS CORONAVIRUS 2 BY RT PCR  CBC  LIPASE, BLOOD  URINALYSIS, ROUTINE W REFLEX MICROSCOPIC  PREGNANCY, URINE    No orders to display    Medications  sodium chloride 0.9 % bolus 1,000 mL (1,000 mLs Intravenous New Bag/Given 04/28/23 0451)     Procedures  /  Critical Care Procedures  ED Course and Medical Decision Making  Initial Impression and Ddx Suspect viral illness, abdomen soft and nontender with no McBurney's point tenderness, lungs clear, no meningismus, normal vital signs, feels dehydrated.  Past medical/surgical history that increases complexity of ED encounter: None  Interpretation of Diagnostics I personally reviewed the laboratory assessment and my interpretation is as follows: No significant blood count or  electrolyte disturbance    Patient Reassessment and Ultimate Disposition/Management     Patient feels better, vitals normal, appropriate for discharge.  Patient management required discussion with the following services or consulting groups:  None  Complexity of Problems Addressed Acute complicated illness or Injury  Additional Data Reviewed and Analyzed Further history obtained from: None  Additional Factors Impacting ED Encounter Risk None  Elmer Sow. Pilar Plate, MD [facility] Emergency Medicine [facility] [email or web address]  Final Clinical Impressions(s) / ED Diagnoses     ICD-10-CM   1. Viral illness  B34.9       ED Discharge Orders     None        Discharge Instructions Discussed with and Provided to Patient:     Discharge Instructions      You were evaluated in the Emergency Department and after careful evaluation, we did not find any emergent condition requiring admission or further testing in the hospital.  Your exam/testing today is overall reassuring.  Symptoms likely due to a viral illness.  Continue Tylenol or Motrin at home for discomfort, plenty of fluids and rest.  Please return to the Emergency Department if you experience any worsening of your condition.   Thank you for allowing [place] to be a part of your care.       Sabas Sous, MD 04/28/23 [phone]

## 2023-07-21 NOTE — Progress Notes (Deleted)
New patient visit   Patient: Melanie Keller   DOB: [birth year]   31 y.o. Female  MRN: [id number] Visit Date: 07/24/2023  Today's healthcare provider: Alfredia Ferguson, PA-C   No chief complaint on file.  Subjective    Melanie Keller is a 32 y.o. female who presents today as a new patient to establish care.  HPI  ***  No past medical history on file. Past Surgical History:  Procedure Laterality Date   TYMPANOSTOMY TUBE PLACEMENT Bilateral    Family Status  Relation Name Status   Mother  (Not Specified)   Father  (Not Specified)   MGM  (Not Specified)   Mat Aunt  (Not Specified)  No partnership data on file   Family History  Problem Relation Age of Onset   Arthritis Mother    Fibromyalgia Mother    Early menopause Mother 20   Cancer Father        prostate cancer   Cancer Maternal Grandmother 60       breast cancer with brain Met   Early menopause Maternal Grandmother 36   Cancer Maternal Aunt 85       colon cancer   Social History   Socioeconomic History   Marital status: Married    Spouse name: Lauren   Number of children: Not on file   Years of education: Not on file   Highest education level: Not on file  Occupational History   Not on file  Tobacco Use   Smoking status: Never   Smokeless tobacco: Never  Vaping Use   Vaping status: Never Used  Substance and Sexual Activity   Alcohol use: Not Currently    Alcohol/week: 2.0 standard drinks of alcohol    Types: 2 Cans of beer per week    Comment: a week   Drug use: No   Sexual activity: Yes    Partners: Female  Other Topics Concern   Not on file  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   Financial Resource Strain: Not on file  Food Insecurity: No Food Insecurity (04/20/2021)   Received from [facility]   Hunger Vital Sign    Worried About Running Out of Food in the Last Year: Never true    Ran Out of Food in the Last Year: Never  true  Transportation Needs: Not on file  Physical Activity: Not on file  Stress: Not on file  Social Connections: Unknown (05/10/2022)   Received from [facility]   Social Network    Social Network: Not on file   Outpatient Medications Prior to Visit  Medication Sig   amoxicillin (AMOXIL) 500 MG capsule Take 1 capsule (500 mg total) by mouth 3 (three) times daily for 7 days   citalopram (CELEXA) 10 MG tablet TAKE 1 TABLET BY MOUTH ONCE DAILY   levothyroxine (SYNTHROID) 25 MCG tablet Take 1 tablet by mouth daily   levothyroxine (SYNTHROID) 25 MCG tablet Take 1 tablet by mouth daily Monday-Friday and take 1 tablet twice a day on Saturday and Sunday   levothyroxine (SYNTHROID) 50 MCG tablet Take 1 tablet by mouth in the morning on an empty stomach   ondansetron (ZOFRAN-ODT) 4 MG disintegrating tablet Dissolve 1 tablet in the mouth every 6 hours as needed.   No facility-administered medications prior to visit.   No Known Allergies  Immunization History  Administered Date(s) Administered   Influenza-Unspecified 09/17/2014, 09/25/2017   PFIZER(Purple Top)SARS-COV-2 Vaccination  12/18/2019, 01/08/2020   PPD Test 01/28/2015, 03/28/2017    Health Maintenance  Topic Date Due   DTaP/Tdap/Td (1 - Tdap) Never done   COVID-19 Vaccine (3 - 2023-24 season) 08/26/2022   PAP SMEAR-Modifier  08/18/2023   INFLUENZA VACCINE  07/27/2023   Hepatitis C Screening  Completed   HIV Screening  Completed   HPV VACCINES  Aged Out    Patient Care Team: Jarold Motto, [place] as PCP - General (Physician Assistant)  Review of Systems    Objective    There were no vitals taken for this visit.  Physical Exam ***  Depression Screen    08/17/2020   11:36 AM 05/11/2020    2:56 PM 03/28/2017   11:29 AM  PHQ 2/9 Scores  PHQ - 2 Score 0 0 0  PHQ- 9 Score 2 0    No results found for any visits on 07/24/23.  Assessment & Plan     ***  No follow-ups on file.     {provider  attestation***:1}   Alfredia Ferguson, PA-C  [facility] [phone] (phone) [phone] (fax)  [facility]

## 2023-07-24 ENCOUNTER — Ambulatory Visit: Payer: No Typology Code available for payment source | Admitting: Physician Assistant

## 2023-08-31 ENCOUNTER — Other Ambulatory Visit (HOSPITAL_COMMUNITY): Payer: Self-pay

## 2023-08-31 MED ORDER — SERTRALINE HCL 50 MG PO TABS
50.0000 mg | ORAL_TABLET | Freq: Every day | ORAL | 2 refills | Status: AC
  Filled 2023-08-31: qty 30, 30d supply, fill #0
  Filled 2023-10-06: qty 30, 30d supply, fill #1
  Filled 2023-11-14: qty 30, 30d supply, fill #2

## 2023-09-01 ENCOUNTER — Other Ambulatory Visit (HOSPITAL_COMMUNITY): Payer: Self-pay

## 2023-09-29 ENCOUNTER — Other Ambulatory Visit (HOSPITAL_COMMUNITY): Payer: Self-pay

## 2023-09-29 MED ORDER — MEDROXYPROGESTERONE ACETATE 10 MG PO TABS
10.0000 mg | ORAL_TABLET | Freq: Every day | ORAL | 0 refills | Status: DC
  Filled 2023-09-29: qty 5, 5d supply, fill #0

## 2023-09-29 MED ORDER — ESTRADIOL 2 MG PO TABS
2.0000 mg | ORAL_TABLET | Freq: Two times a day (BID) | ORAL | 3 refills | Status: DC
  Filled 2023-09-29: qty 60, 30d supply, fill #0
  Filled 2023-11-08: qty 60, 30d supply, fill #1
  Filled 2023-12-06: qty 60, 30d supply, fill #2

## 2023-09-30 ENCOUNTER — Other Ambulatory Visit (HOSPITAL_COMMUNITY): Payer: Self-pay

## 2023-10-02 ENCOUNTER — Other Ambulatory Visit (HOSPITAL_COMMUNITY): Payer: Self-pay

## 2023-10-08 ENCOUNTER — Other Ambulatory Visit (HOSPITAL_COMMUNITY): Payer: Self-pay

## 2023-10-08 MED ORDER — IBUPROFEN 600 MG PO TABS
600.0000 mg | ORAL_TABLET | Freq: Four times a day (QID) | ORAL | 0 refills | Status: DC | PRN
  Filled 2023-10-08: qty 30, 8d supply, fill #0

## 2023-10-09 ENCOUNTER — Other Ambulatory Visit (HOSPITAL_COMMUNITY): Payer: Self-pay

## 2023-10-17 ENCOUNTER — Other Ambulatory Visit (HOSPITAL_COMMUNITY): Payer: Self-pay

## 2023-10-17 MED ORDER — DOXYCYCLINE HYCLATE 100 MG PO CAPS
100.0000 mg | ORAL_CAPSULE | Freq: Two times a day (BID) | ORAL | 2 refills | Status: DC
  Filled 2023-10-17: qty 10, 5d supply, fill #0

## 2023-10-18 ENCOUNTER — Other Ambulatory Visit: Payer: Self-pay

## 2023-10-18 ENCOUNTER — Other Ambulatory Visit (HOSPITAL_COMMUNITY): Payer: Self-pay

## 2023-10-18 MED ORDER — "BD LUER-LOK SYRINGE 18G X 1-1/2"" 3 ML MISC"
3 refills | Status: DC
  Filled 2023-10-18: qty 30, 30d supply, fill #0
  Filled 2023-11-30: qty 30, 30d supply, fill #1

## 2023-10-18 MED ORDER — PROGESTERONE 50 MG/ML IM OIL
50.0000 mg | TOPICAL_OIL | Freq: Every day | INTRAMUSCULAR | 3 refills | Status: DC
  Filled 2023-10-18: qty 30, 30d supply, fill #0
  Filled 2023-11-30: qty 30, 30d supply, fill #1

## 2023-10-18 MED ORDER — ESTRADIOL 0.1 MG/24HR TD PTTW
2.0000 | MEDICATED_PATCH | TRANSDERMAL | 3 refills | Status: DC
  Filled 2023-10-18: qty 16, 24d supply, fill #0
  Filled 2023-12-06: qty 16, 24d supply, fill #1

## 2023-10-18 MED ORDER — ESTRADIOL 2 MG PO TABS
2.0000 mg | ORAL_TABLET | Freq: Two times a day (BID) | ORAL | 3 refills | Status: DC
  Filled 2023-10-18: qty 60, 30d supply, fill #0

## 2023-10-18 MED ORDER — METHYLPREDNISOLONE 4 MG PO TABS
8.0000 mg | ORAL_TABLET | Freq: Two times a day (BID) | ORAL | 1 refills | Status: DC
  Filled 2023-10-18: qty 16, 4d supply, fill #0

## 2023-10-18 MED ORDER — "BD DISP NEEDLES 22G X 1-1/2"" MISC"
3 refills | Status: DC
  Filled 2023-10-18: qty 30, 30d supply, fill #0

## 2023-10-19 ENCOUNTER — Other Ambulatory Visit: Payer: Self-pay

## 2023-10-20 ENCOUNTER — Other Ambulatory Visit (HOSPITAL_COMMUNITY): Payer: Self-pay

## 2023-11-09 ENCOUNTER — Other Ambulatory Visit (HOSPITAL_COMMUNITY): Payer: Self-pay

## 2023-12-01 ENCOUNTER — Other Ambulatory Visit (HOSPITAL_COMMUNITY): Payer: Self-pay

## 2023-12-04 ENCOUNTER — Other Ambulatory Visit (HOSPITAL_COMMUNITY): Payer: Self-pay

## 2023-12-09 ENCOUNTER — Other Ambulatory Visit (HOSPITAL_COMMUNITY): Payer: Self-pay

## 2023-12-25 ENCOUNTER — Other Ambulatory Visit (HOSPITAL_COMMUNITY): Payer: Self-pay

## 2023-12-25 MED ORDER — PROGESTERONE 200 MG PO CAPS
200.0000 mg | ORAL_CAPSULE | Freq: Every day | ORAL | 0 refills | Status: DC
  Filled 2023-12-25: qty 14, 14d supply, fill #0

## 2023-12-27 NOTE — L&D Delivery Note (Addendum)
 Delivery Note Pt continued to labor well with no augmenttation after epidural and AROM ( s/p cytotec  last night). She had prolonged decel and on exam was noted to be completely dilated.  She pushed twice and at 6:24 PM a viable female was delivered via Vaginal, Spontaneous (Presentation: Left Occiput Anterior).  APGAR: 9, 9; weight  pending. When anterior shoulder failed to deliver with next push, posterior shoulder was rotated ( woods screw maneuver) with successful delivery; body easily followed.  Spontaneous cry was noted. Cord was clamped and cut after a minute delay with infant on mothers' abdomen.    Placenta status: Spontaneous, Intact.  Cord: 3 vessels with the following complications: None.  Melanie Keller. Cord pH: n/a  Anesthesia: Epidural Episiotomy: None Lacerations: 1st degree;Vaginal Suture Repair: 2.0 vicryl Est. Blood Loss (mL):  724  Mom to postpartum.  Baby to Couplet care / Skin to Skin They desire circumcision for baby .  Melanie Keller 07/27/2024, 6:49 PM

## 2023-12-28 ENCOUNTER — Other Ambulatory Visit (HOSPITAL_COMMUNITY): Payer: Self-pay

## 2024-01-15 ENCOUNTER — Other Ambulatory Visit (HOSPITAL_COMMUNITY): Payer: Self-pay

## 2024-01-15 MED ORDER — ONDANSETRON 4 MG PO TBDP
4.0000 mg | ORAL_TABLET | Freq: Four times a day (QID) | ORAL | 0 refills | Status: DC
  Filled 2024-01-15 – 2024-02-29 (×2): qty 20, 5d supply, fill #0

## 2024-01-26 ENCOUNTER — Other Ambulatory Visit (HOSPITAL_COMMUNITY): Payer: Self-pay

## 2024-02-02 LAB — HEPATITIS C ANTIBODY: HCV Ab: NEGATIVE

## 2024-02-02 LAB — OB RESULTS CONSOLE HIV ANTIBODY (ROUTINE TESTING): HIV: NONREACTIVE

## 2024-02-02 LAB — OB RESULTS CONSOLE RPR: RPR: NONREACTIVE

## 2024-02-02 LAB — OB RESULTS CONSOLE RUBELLA ANTIBODY, IGM: Rubella: IMMUNE

## 2024-02-02 LAB — OB RESULTS CONSOLE HEPATITIS B SURFACE ANTIGEN: Hepatitis B Surface Ag: NEGATIVE

## 2024-02-29 ENCOUNTER — Other Ambulatory Visit (HOSPITAL_COMMUNITY): Payer: Self-pay

## 2024-03-11 ENCOUNTER — Other Ambulatory Visit (HOSPITAL_COMMUNITY): Payer: Self-pay

## 2024-07-10 LAB — OB RESULTS CONSOLE GBS: GBS: NEGATIVE

## 2024-07-22 ENCOUNTER — Encounter (HOSPITAL_COMMUNITY): Payer: Self-pay | Admitting: *Deleted

## 2024-07-22 ENCOUNTER — Telehealth (HOSPITAL_COMMUNITY): Payer: Self-pay | Admitting: *Deleted

## 2024-07-22 NOTE — Telephone Encounter (Signed)
 Preadmission screen

## 2024-07-27 ENCOUNTER — Encounter (HOSPITAL_COMMUNITY): Payer: Self-pay | Admitting: Obstetrics and Gynecology

## 2024-07-27 ENCOUNTER — Inpatient Hospital Stay (HOSPITAL_COMMUNITY): Payer: Self-pay

## 2024-07-27 ENCOUNTER — Other Ambulatory Visit: Payer: Self-pay

## 2024-07-27 ENCOUNTER — Encounter (HOSPITAL_COMMUNITY): Payer: Self-pay | Admitting: Anesthesiology

## 2024-07-27 ENCOUNTER — Inpatient Hospital Stay (HOSPITAL_COMMUNITY): Payer: Self-pay | Admitting: Anesthesiology

## 2024-07-27 ENCOUNTER — Inpatient Hospital Stay (HOSPITAL_COMMUNITY)
Admission: RE | Admit: 2024-07-27 | Discharge: 2024-07-28 | DRG: 807 | Disposition: A | Discharge status: Home / Self Care | Payer: Self-pay | Attending: Obstetrics and Gynecology | Admitting: Obstetrics and Gynecology

## 2024-07-27 DIAGNOSIS — O9962 Diseases of the digestive system complicating childbirth: Secondary | ICD-10-CM | POA: Diagnosis present

## 2024-07-27 DIAGNOSIS — K219 Gastro-esophageal reflux disease without esophagitis: Secondary | ICD-10-CM | POA: Diagnosis present

## 2024-07-27 DIAGNOSIS — O26893 Other specified pregnancy related conditions, third trimester: Secondary | ICD-10-CM | POA: Diagnosis present

## 2024-07-27 DIAGNOSIS — O9902 Anemia complicating childbirth: Principal | ICD-10-CM | POA: Diagnosis present

## 2024-07-27 DIAGNOSIS — Z3490 Encounter for supervision of normal pregnancy, unspecified, unspecified trimester: Principal | ICD-10-CM

## 2024-07-27 DIAGNOSIS — Z3A39 39 weeks gestation of pregnancy: Secondary | ICD-10-CM

## 2024-07-27 LAB — TYPE AND SCREEN
ABO/RH(D): A POS
Antibody Screen: NEGATIVE

## 2024-07-27 LAB — CBC
HCT: 35.7 % — ABNORMAL LOW (ref 36.0–46.0)
Hemoglobin: 12.6 g/dL (ref 12.0–15.0)
MCH: 31.2 pg (ref 26.0–34.0)
MCHC: 35.3 g/dL (ref 30.0–36.0)
MCV: 88.4 fL (ref 80.0–100.0)
Platelets: 250 K/uL (ref 150–400)
RBC: 4.04 MIL/uL (ref 3.87–5.11)
RDW: 12.8 % (ref 11.5–15.5)
WBC: 13.3 K/uL — ABNORMAL HIGH (ref 4.0–10.5)
nRBC: 0 % (ref 0.0–0.2)

## 2024-07-27 LAB — RPR: RPR Ser Ql: NONREACTIVE

## 2024-07-27 MED ORDER — COCONUT OIL OIL: 1.0000 | TOPICAL_OIL | Status: DC | PRN

## 2024-07-27 MED ORDER — TETANUS-DIPHTH-ACELL PERTUSSIS 5-2.5-18.5 LF-MCG/0.5 IM SUSY: 0.5000 mL | PREFILLED_SYRINGE | Freq: Once | INTRAMUSCULAR | Status: DC

## 2024-07-27 MED ORDER — PRENATAL MULTIVITAMIN CH
1.0000 | ORAL_TABLET | Freq: Every day | ORAL | Status: DC
  Administered 2024-07-28: 1 via ORAL
  Filled 2024-07-27: qty 1

## 2024-07-27 MED ORDER — PHENYLEPHRINE 80 MCG/ML (10ML) SYRINGE FOR IV PUSH (FOR BLOOD PRESSURE SUPPORT)
80.0000 ug | PREFILLED_SYRINGE | INTRAVENOUS | Status: DC | PRN
  Administered 2024-07-27: 80 ug via INTRAVENOUS

## 2024-07-27 MED ORDER — OXYTOCIN BOLUS FROM INFUSION
333.0000 mL | Freq: Once | INTRAVENOUS | Status: AC
  Administered 2024-07-27: 333 mL via INTRAVENOUS

## 2024-07-27 MED ORDER — WITCH HAZEL-GLYCERIN EX PADS: 1.0000 | MEDICATED_PAD | CUTANEOUS | Status: DC | PRN

## 2024-07-27 MED ORDER — PHENYLEPHRINE 80 MCG/ML (10ML) SYRINGE FOR IV PUSH (FOR BLOOD PRESSURE SUPPORT)
80.0000 ug | PREFILLED_SYRINGE | INTRAVENOUS | Status: DC | PRN
  Filled 2024-07-27: qty 10

## 2024-07-27 MED ORDER — ZOLPIDEM TARTRATE 5 MG PO TABS: 5.0000 mg | ORAL_TABLET | Freq: Every evening | ORAL | Status: DC | PRN

## 2024-07-27 MED ORDER — METHYLERGONOVINE MALEATE 0.2 MG/ML IJ SOLN
INTRAMUSCULAR | Status: AC
  Filled 2024-07-27: qty 1

## 2024-07-27 MED ORDER — LACTATED RINGERS IV SOLN: INTRAVENOUS | Status: DC

## 2024-07-27 MED ORDER — EPHEDRINE 5 MG/ML INJ: 10.0000 mg | INTRAVENOUS | Status: DC | PRN

## 2024-07-27 MED ORDER — LACTATED RINGERS IV SOLN: 500.0000 mL | Freq: Once | INTRAVENOUS | Status: DC

## 2024-07-27 MED ORDER — FENTANYL-BUPIVACAINE-NACL 0.5-0.125-0.9 MG/250ML-% EP SOLN
12.0000 mL/h | EPIDURAL | Status: DC | PRN
  Administered 2024-07-27: 12 mL/h via EPIDURAL
  Filled 2024-07-27: qty 250

## 2024-07-27 MED ORDER — LIDOCAINE HCL (PF) 1 % IJ SOLN
INTRAMUSCULAR | Status: DC | PRN
  Administered 2024-07-27 (×2): 5 mL via EPIDURAL

## 2024-07-27 MED ORDER — SIMETHICONE 80 MG PO CHEW: 80.0000 mg | CHEWABLE_TABLET | ORAL | Status: DC | PRN

## 2024-07-27 MED ORDER — OXYCODONE HCL 5 MG PO TABS: 10.0000 mg | ORAL_TABLET | ORAL | Status: DC | PRN

## 2024-07-27 MED ORDER — SENNOSIDES-DOCUSATE SODIUM 8.6-50 MG PO TABS
2.0000 | ORAL_TABLET | Freq: Every day | ORAL | Status: DC
  Administered 2024-07-28: 2 via ORAL
  Filled 2024-07-27: qty 2

## 2024-07-27 MED ORDER — TERBUTALINE SULFATE 1 MG/ML IJ SOLN: 0.2500 mg | Freq: Once | INTRAMUSCULAR | Status: DC | PRN

## 2024-07-27 MED ORDER — METHYLERGONOVINE MALEATE 0.2 MG/ML IJ SOLN
INTRAMUSCULAR | Status: AC
Start: 2024-07-27 — End: 2024-07-28
  Filled 2024-07-27: qty 1

## 2024-07-27 MED ORDER — LACTATED RINGERS IV SOLN
500.0000 mL | INTRAVENOUS | Status: DC | PRN
  Administered 2024-07-27: 500 mL via INTRAVENOUS

## 2024-07-27 MED ORDER — LIDOCAINE HCL (PF) 1 % IJ SOLN: 30.0000 mL | INTRAMUSCULAR | Status: DC | PRN

## 2024-07-27 MED ORDER — OXYCODONE-ACETAMINOPHEN 5-325 MG PO TABS: 2.0000 | ORAL_TABLET | ORAL | Status: DC | PRN

## 2024-07-27 MED ORDER — DIPHENHYDRAMINE HCL 25 MG PO CAPS: 25.0000 mg | ORAL_CAPSULE | Freq: Four times a day (QID) | ORAL | Status: DC | PRN

## 2024-07-27 MED ORDER — ONDANSETRON HCL 4 MG/2ML IJ SOLN
4.0000 mg | Freq: Four times a day (QID) | INTRAMUSCULAR | Status: DC | PRN
  Administered 2024-07-27: 4 mg via INTRAVENOUS
  Filled 2024-07-27: qty 2

## 2024-07-27 MED ORDER — DIPHENHYDRAMINE HCL 50 MG/ML IJ SOLN: 12.5000 mg | INTRAMUSCULAR | Status: DC | PRN

## 2024-07-27 MED ORDER — METHYLERGONOVINE MALEATE 0.2 MG/ML IJ SOLN: 0.2000 mg | INTRAMUSCULAR | Status: DC | PRN

## 2024-07-27 MED ORDER — IBUPROFEN 600 MG PO TABS
600.0000 mg | ORAL_TABLET | Freq: Four times a day (QID) | ORAL | Status: DC
  Administered 2024-07-27 – 2024-07-28 (×4): 600 mg via ORAL
  Filled 2024-07-27 (×4): qty 1

## 2024-07-27 MED ORDER — CITALOPRAM HYDROBROMIDE 20 MG PO TABS: 10.0000 mg | ORAL_TABLET | Freq: Every day | ORAL | Status: DC

## 2024-07-27 MED ORDER — METHYLERGONOVINE MALEATE 0.2 MG/ML IJ SOLN
0.2000 mg | Freq: Once | INTRAMUSCULAR | Status: AC
  Administered 2024-07-27: 0.2 mg via INTRAMUSCULAR

## 2024-07-27 MED ORDER — OXYTOCIN-SODIUM CHLORIDE 30-0.9 UT/500ML-% IV SOLN
2.5000 [IU]/h | INTRAVENOUS | Status: DC
  Filled 2024-07-27: qty 500

## 2024-07-27 MED ORDER — BENZOCAINE-MENTHOL 20-0.5 % EX AERO
1.0000 | INHALATION_SPRAY | CUTANEOUS | Status: DC | PRN
  Filled 2024-07-27: qty 56

## 2024-07-27 MED ORDER — OXYCODONE HCL 5 MG PO TABS: 5.0000 mg | ORAL_TABLET | ORAL | Status: DC | PRN

## 2024-07-27 MED ORDER — MISOPROSTOL 25 MCG QUARTER TABLET
25.0000 ug | ORAL_TABLET | ORAL | Status: DC | PRN
  Administered 2024-07-27 (×2): 25 ug via VAGINAL
  Filled 2024-07-27 (×2): qty 1

## 2024-07-27 MED ORDER — OXYCODONE-ACETAMINOPHEN 5-325 MG PO TABS: 1.0000 | ORAL_TABLET | ORAL | Status: DC | PRN

## 2024-07-27 MED ORDER — METHYLERGONOVINE MALEATE 0.2 MG PO TABS: 0.2000 mg | ORAL_TABLET | ORAL | Status: DC | PRN

## 2024-07-27 MED ORDER — SOD CITRATE-CITRIC ACID 500-334 MG/5ML PO SOLN: 30.0000 mL | ORAL | Status: DC | PRN

## 2024-07-27 MED ORDER — ONDANSETRON HCL 4 MG PO TABS: 4.0000 mg | ORAL_TABLET | ORAL | Status: DC | PRN

## 2024-07-27 MED ORDER — ACETAMINOPHEN 325 MG PO TABS: 650.0000 mg | ORAL_TABLET | ORAL | Status: DC | PRN

## 2024-07-27 MED ORDER — ONDANSETRON HCL 4 MG/2ML IJ SOLN
INTRAMUSCULAR | Status: AC
Start: 2024-07-27 — End: 2024-07-28
  Filled 2024-07-27: qty 2

## 2024-07-27 MED ORDER — OXYTOCIN-SODIUM CHLORIDE 30-0.9 UT/500ML-% IV SOLN: 2.5000 [IU]/h | INTRAVENOUS | Status: DC | PRN

## 2024-07-27 MED ORDER — FERROUS SULFATE 325 (65 FE) MG PO TABS
325.0000 mg | ORAL_TABLET | Freq: Two times a day (BID) | ORAL | Status: DC
  Administered 2024-07-28 (×2): 325 mg via ORAL
  Filled 2024-07-27 (×3): qty 1

## 2024-07-27 MED ORDER — ONDANSETRON HCL 4 MG/2ML IJ SOLN
4.0000 mg | INTRAMUSCULAR | Status: DC | PRN
  Administered 2024-07-27: 4 mg via INTRAVENOUS

## 2024-07-27 MED ORDER — DIBUCAINE (PERIANAL) 1 % EX OINT: 1.0000 | TOPICAL_OINTMENT | CUTANEOUS | Status: DC | PRN

## 2024-07-27 NOTE — Progress Notes (Signed)
 Patient ID: Melanie Keller, female   DOB: Jun 23, 1991, 33 y.o.   MRN: [id number] Pt reports no complaints. Does feel anxious VSS GEN - NAD  EFM - 150s, mod variability TOCO - ctxs q 2 mins  SVE - 3/30/-3  A/P: 32yo G2P1001 female at 64 0/7wks s.p elective IOL with cytotec  x 2 doses - AROM with clear fluid noted - Pain control prn  - Augment with pitocin  if indicated - Anticipate svd

## 2024-07-27 NOTE — Lactation Note (Signed)
 This note was copied from a baby's chart. Lactation Consultation Note  Patient Name: Melanie Keller Unijb'd Date: 07/27/2024 Age:33 hours Reason for consult: Initial assessment;Term  P2- [name]) reports that this infant latches really well so far, much better than her first son. Melanie Keller reports breastfeeding her first child for only a few days, then switching to exclusive formula feeding due to constant crying like he was starving. [name], infant's other mom), confirms that this is true. Melanie Keller plans to be the only lactating parent for infant. Melanie Keller was concerned about how we know if infant is getting enough. LC reviewed weight loss and expected output from infant. LC offered to demonstrate hand expression to visualize the colostrum as well. Melanie Keller agreed. LC was able to easily express large drops of colostrum. Infant had recently fed, so LC requested to be called for a latch assessment. [name]  LC reviewed the first 24 hr birthday nap, day 2 cluster feeding, feeding infant on cue 8-12x in 24 hrs, not allowing infant to go over 3 hrs without a feeding, CDC milk storage guidelines, LC services handout and engorgement/breast care. [name] Does the patient have breastfeeding experience prior to this delivery?: Yes How long did the patient breastfeed?: a few days, then switched to formula only  Feeding Mother's Current Feeding Choice: Breast Milk  Lactation Tools Discussed/Used Pump Education: Milk Storage  Interventions Interventions: Breast feeding basics reviewed;Hand express;Education;LC Services brochure  Discharge Discharge Education: Engorgement and breast care;Warning signs for feeding baby Pump: Hands Free;Personal  Consult Status Consult Status: Follow-up Date:  07/28/24 Follow-up type: In-patient    Melanie Keller BS, [name] 07/27/2024, 9:56 PM

## 2024-07-27 NOTE — Progress Notes (Signed)
 Patient ID: Melanie Keller, female   DOB: 05-20-1991, 33 y.o.   MRN: [id number] Pt comfortable with epidural  VSS GEN- NAD EFM - 130, moderate variability, CAT 1 TOCO - ctxs q 1-4 mins irreg SVE 5/70/-2  A/P: G2P1001 at 39 0/7wks s/p cytotec          Progressing well since AROM         Augment with pitocin  if ctxs space out         Expectant mgmt

## 2024-07-27 NOTE — Anesthesia Procedure Notes (Signed)
 Epidural Patient location during procedure: OB Start time: 07/27/2024 1:45 PM End time: 07/27/2024 1:54 PM  Staffing Anesthesiologist: Jerrye Sharper, MD Performed: anesthesiologist   Preanesthetic Checklist Completed: patient identified, IV checked, site marked, risks and benefits discussed, surgical consent, monitors and equipment checked, pre-op evaluation and timeout performed  Epidural Patient position: sitting Prep: DuraPrep and site prepped and draped Patient monitoring: continuous pulse ox and blood pressure Approach: midline Location: L3-L4 Injection technique: LOR air  Needle:  Needle type: Tuohy  Needle gauge: 17 G Needle length: 9 cm and 9 Needle insertion depth: 5 cm Catheter type: closed end flexible Catheter size: 19 Gauge Catheter at skin depth: 10 cm Test dose: negative and Other  Assessment Events: blood not aspirated, no cerebrospinal fluid, injection not painful, no injection resistance, no paresthesia and negative IV test  Additional Notes Patient identified. Risks and benefits discussed including failed block, incomplete  Pain control, post dural puncture headache, nerve damage, paralysis, blood pressure Changes, nausea, vomiting, reactions to medications-both toxic and allergic and post Partum back pain. All questions were answered. Patient expressed understanding and wished to proceed. Sterile technique was used throughout procedure. Epidural site was Dressed with sterile barrier dressing. No paresthesias, signs of intravascular injection Or signs of intrathecal spread were encountered.  Patient was more comfortable after the epidural was dosed. Please see RN's note for documentation of vital signs and FHR which are stable. Reason for block:procedure for pain

## 2024-07-27 NOTE — Anesthesia Preprocedure Evaluation (Signed)
 Anesthesia Evaluation  Patient identified by MRN, date of birth, ID band Patient awake    Reviewed: Allergy & Precautions, Patient's Chart, lab work & pertinent test results  Airway Mallampati: II  TM Distance: >3 FB     Dental no notable dental hx.    Pulmonary neg pulmonary ROS   Pulmonary exam normal        Cardiovascular negative cardio ROS Normal cardiovascular exam Rhythm:Regular     Neuro/Psych  PSYCHIATRIC DISORDERS  Depression    negative neurological ROS     GI/Hepatic Neg liver ROS,GERD  ,,  Endo/Other  negative endocrine ROS    Renal/GU negative Renal ROS  negative genitourinary   Musculoskeletal negative musculoskeletal ROS (+)    Abdominal   Peds  Hematology  (+) Blood dyscrasia, anemia   Anesthesia Other Findings   Reproductive/Obstetrics (+) Pregnancy                              Anesthesia Physical Anesthesia Plan  ASA: 2  Anesthesia Plan: Epidural   Post-op Pain Management:    Induction:   PONV Risk Score and Plan:   Airway Management Planned: Natural Airway  Additional Equipment: Fetal Monitoring and None  Intra-op Plan:   Post-operative Plan:   Informed Consent: I have reviewed the patients History and Physical, chart, labs and discussed the procedure including the risks, benefits and alternatives for the proposed anesthesia with the patient or authorized representative who has indicated his/her understanding and acceptance.       Plan Discussed with: Anesthesiologist  Anesthesia Plan Comments:          Anesthesia Quick Evaluation

## 2024-07-27 NOTE — H&P (Addendum)
 Melanie Keller is a 33 y.o. G51P1001 female presenting for elective IOL at 79 0/7wks. 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 Neg genetics. Hx primary ovarian failure  Carrier screen neg. GBS neg  OB History     Gravida  2   Para  1   Term  1   Preterm      AB      Living  1      SAB      IAB      Ectopic      Multiple  0   Live Births  1          No past medical history on file. Past Surgical History:  Procedure Laterality Date   TYMPANOSTOMY TUBE PLACEMENT Bilateral    Family History: family history includes Arthritis in her mother; Cancer in her father; Cancer (age of onset: 3) in her maternal grandmother; Cancer (age of onset: 29) in her maternal aunt; Early menopause (age of onset: 45) in her maternal grandmother and mother; Fibromyalgia in her mother. Social History:  reports that she has never smoked. She has never used smokeless tobacco. She reports that she does not currently use alcohol after a past usage of about 2.0 standard drinks of alcohol per week. She reports that she does not use drugs.     Maternal Diabetes: No Genetic Screening: Normal Maternal Ultrasounds/Referrals: Normal Fetal Ultrasounds or other Referrals:  None Maternal Substance Abuse:  No Significant Maternal Medications:  None Significant Maternal Lab Results:  Group B Strep negative Number of Prenatal Visits:greater than 3 verified prenatal visits Maternal Vaccinations:TDap, Flu, and Covid Other Comments:  None  Review of Systems  Constitutional:  Negative for activity change and fatigue.  Eyes:  Negative for photophobia and visual disturbance.  Respiratory:  Negative for chest tightness and shortness of breath.   Cardiovascular:  Negative for chest pain, palpitations and leg swelling.  Gastrointestinal:  Negative for abdominal pain.  Endocrine: Negative for polyphagia.  Genitourinary:  Negative  for pelvic pain.  Musculoskeletal:  Negative for back pain.  Neurological:  Negative for headaches.  Psychiatric/Behavioral:  The patient is nervous/anxious.    Maternal Medical History:  Reason for admission: IOL elective due to IVF  Contractions: Frequency: rare.   Perceived severity is mild.   Fetal activity: Perceived fetal activity is normal.   Prenatal Complications - Diabetes: none.   Dilation: 3 Effacement (%): 30 Station: -3 Exam by:: Indigo Chaddock MD Blood pressure 113/70, pulse (!) 55, temperature 97.8 F (36.6 C), temperature source Oral, resp. rate 15, height 5' 6 (1.676 m), weight 78.6 kg, SpO2 98%, unknown if currently breastfeeding. Maternal Exam:  Uterine Assessment: Contraction strength is mild.  Contraction frequency is regular.  Abdomen: Patient reports no abdominal tenderness. Estimated fetal weight is AGA.   Fetal presentation: vertex Introitus: Normal vulva. Normal vagina.  Pelvis: adequate for delivery.   Cervix: Cervix evaluated by digital exam.     Fetal Exam Fetal Monitor Review: Baseline rate: 140.  Variability: moderate (6-25 bpm).   Pattern: accelerations present and no decelerations.   Fetal State Assessment: Category I - tracings are normal.   Physical Exam Vitals and nursing note reviewed. Exam conducted with a chaperone present.  Constitutional:      Appearance: Normal appearance.  Cardiovascular:     Rate and Rhythm: Normal rate.     Pulses: Normal pulses.  Pulmonary:  Effort: Pulmonary effort is normal.  Abdominal:     Palpations: Abdomen is soft.  Genitourinary:    General: Normal vulva.  Musculoskeletal:        General: Swelling present. Normal range of motion.     Cervical back: Normal range of motion.  Skin:    General: Skin is warm and dry.     Capillary Refill: Capillary refill takes 2 to 3 seconds.  Neurological:     General: No focal deficit present.     Mental Status: She is alert and oriented to person, place, and time.  Mental status is at baseline.  Psychiatric:        Mood and Affect: Mood normal.        Behavior: Behavior normal.        Thought Content: Thought content normal.        Judgment: Judgment normal.     Prenatal labs: ABO, Rh: --/--/A POS (08/02 0030) Antibody: NEG (08/02 0030) Rubella: Immune (02/07 0000) RPR: NON REACTIVE (08/02 0031)  HBsAg: Negative (02/07 0000)  HIV: Non-reactive (02/07 0000)  GBS: Negative/-- (07/16 0000)   Assessment/Plan: 32yo G2P1001 female at 72 0/7wks for IOL - Admitted overnight  - S/P two doses cytotec  vaginally - 3cm dil now; offered AROM - clear fluid - Augment with pitocin  prn  - Pain control relief per pt request - Anticipate svd    Ted ORN Stephanos Fan 07/27/2024, 11:42 AM

## 2024-07-28 LAB — CBC
HCT: 30.2 % — ABNORMAL LOW (ref 36.0–46.0)
Hemoglobin: 10.5 g/dL — ABNORMAL LOW (ref 12.0–15.0)
MCH: 31.1 pg (ref 26.0–34.0)
MCHC: 34.8 g/dL (ref 30.0–36.0)
MCV: 89.3 fL (ref 80.0–100.0)
Platelets: 189 K/uL (ref 150–400)
RBC: 3.38 MIL/uL — ABNORMAL LOW (ref 3.87–5.11)
RDW: 13.2 % (ref 11.5–15.5)
WBC: 15.5 K/uL — ABNORMAL HIGH (ref 4.0–10.5)
nRBC: 0 % (ref 0.0–0.2)

## 2024-07-28 LAB — BIRTH TISSUE RECOVERY COLLECTION (PLACENTA DONATION)

## 2024-07-28 MED ORDER — IBUPROFEN 600 MG PO TABS
600.0000 mg | ORAL_TABLET | Freq: Four times a day (QID) | ORAL | 1 refills | Status: AC | PRN
  Filled 2024-07-28: qty 40, 10d supply, fill #0

## 2024-07-28 MED ORDER — IBUPROFEN 600 MG PO TABS
600.0000 mg | ORAL_TABLET | Freq: Four times a day (QID) | ORAL | 0 refills | Status: AC | PRN
  Filled 2024-07-28: qty 40, 10d supply, fill #0

## 2024-07-28 NOTE — Lactation Note (Signed)
 This note was copied from a baby's chart. Lactation Consultation Note  Patient Name: Melanie Keller Unijb'd Date: 07/28/2024 Age:33 hours Reason for consult: Follow-up assessment;Term;Nipple pain/trauma  P2- MOB had infant latched to the right breast in the cross cradle hold when LC entered the room. MOB reports that infant had made her nipples sore last night do to some poor latches. LC noted that infant was latched well at this time, but he was chomping only. LC reassured MOB that with practice he will start nutritively sucking instead. LC noted no nipple damage at this time, but they did look red. LC provided MOB with coconut oil and encouraged her to use it after every breastfeeding. LC reviewed infant's weight loss and his excellent output with both parents. LC reassured them that infant is getting enough colostrum at this time. LC reviewed cluster feeding and what to expect. Both parents thanked [facility] and denied having further questions or concerns. LC reviewed MOB's flange insert size, the manual pump and the LC services handout. LC encouraged both parents to call for further assistance even when discharged.  Maternal Data Has patient been taught Hand Expression?: Yes Does the patient have breastfeeding experience prior to this delivery?: Yes How long did the patient breastfeed?: 1 day  Feeding Mother's Current Feeding Choice: Breast Milk  LATCH Score Latch: Repeated attempts needed to sustain latch, nipple held in mouth throughout feeding, stimulation needed to elicit sucking reflex.  Audible Swallowing: A few with stimulation  Type of Nipple: Everted at rest and after stimulation  Comfort (Breast/Nipple): Filling, red/small blisters or bruises, mild/mod discomfort  Hold (Positioning): No assistance needed to correctly position infant at breast.  LATCH Score: 7   Lactation Tools Discussed/Used Tools: Pump;Flanges;Coconut oil Flange Size: 18 Breast pump type: Manual Pump  Education: Setup, frequency, and cleaning;Milk Storage Reason for Pumping: MOB request Pumping frequency: 15-20 min every 3 hrs  Interventions Interventions: Breast feeding basics reviewed;Coconut oil;Hand pump;Education;LC Services brochure  Discharge Discharge Education: Engorgement and breast care;Warning signs for feeding baby Pump: Manual;Hands Free;Personal  Consult Status Consult Status: Complete Date: 07/28/24    [name], [name] 07/28/2024, 6:07 PM

## 2024-07-28 NOTE — Discharge Instructions (Signed)
 Call office with any concerns [phone]

## 2024-07-28 NOTE — Progress Notes (Signed)
 Post Partum Day 1 Subjective: no complaints, up ad lib, voiding, tolerating PO, + flatus, and lochia mild. She denies dizziness, CP, SOB. She is bonding well with baby - breastfeeding. No complaints   Objective: Blood pressure (!) 99/94, pulse (!) 50, temperature 97.9 F (36.6 C), temperature source Oral, resp. rate 17, height 5' 6 (1.676 m), weight 78.6 kg, SpO2 100%, unknown if currently breastfeeding.  Physical Exam:  General: alert, cooperative, and no distress Lochia: appropriate Uterine Fundus: firm Incision: n/a DVT Evaluation: No evidence of DVT seen on physical exam. Negative Homan's sign.  Recent Labs    07/27/24 0031 07/28/24 0501  HGB 12.6 10.5*  HCT 35.7* 30.2*    Assessment/Plan: Plan for discharge tomorrow, Breastfeeding, and Circumcision prior to discharge   LOS: 1 day   Terease Marcotte W Jalesa Thien, DO 07/28/2024, 8:46 AM

## 2024-07-28 NOTE — Progress Notes (Signed)
 MOB was referred for history of depression. * Referral screened out by Clinical Social Worker because none of the following criteria appear to apply: ~ History of anxiety/depression during this pregnancy, or of post-partum depression following prior delivery. ~ Diagnosis of anxiety and/or depression within last 3 years. MOB's diagnosis of depression dates back to 2021. No mental health concerns noted in prenatal care records.  OR * MOB's symptoms currently being treated with medication and/or therapy. Please contact the Clinical Social Worker if needs arise, by [facility] request, or if MOB scores greater than 9/yes to question 10 on Edinburgh Postpartum Depression Screen.  Signed,  Sharyne LOIS Roulette, MSW, LCSWA, LCASA 04-02-24 11:00 AM

## 2024-07-28 NOTE — Discharge Summary (Signed)
 Postpartum Discharge Summary  Date of Service updated      Patient Name: Melanie Keller DOB: 1991-03-29 MRN: [id number]  Date of admission: 07/27/2024 Delivery date:07/27/2024 Delivering provider: DELANA BASE [facility] Date of discharge: 07/28/2024  Admitting diagnosis: Pregnancy [Z34.90] Intrauterine pregnancy: [redacted]w[redacted]d     Secondary diagnosis:  Principal Problem:   Pregnancy  Additional problems: none    Discharge diagnosis: Term Pregnancy Delivered                                              Post partum procedures:n/a Augmentation: AROM and Cytotec  Complications: None  Hospital course: Induction of Labor With Vaginal Delivery   33 y.o. yo G2P2002 at [redacted]w[redacted]d was admitted to the hospital 07/27/2024 for induction of labor.  Indication for induction: Elective.  Patient had an labor course complicated by n/a Membrane Rupture Time/Date: 10:29 AM,07/27/2024  Delivery Method:Vaginal, Spontaneous Operative Delivery:N/A Episiotomy: None Lacerations:  1st degree;Vaginal Details of delivery can be found in separate delivery note.  Patient had a postpartum course complicated by n/a. Patient is discharged home 07/28/24.  Newborn Data: Birth date:07/27/2024 Birth time:6:24 PM Gender:Female Living status:Living Apgars:9 ,9  Weight:3430 g  Magnesium Sulfate received: No BMZ received: No Rhophylac:N/A MMR:N/A T-DaP:Given prenatally Flu: Yes RSV Vaccine received: No Transfusion:No Immunizations administered: Immunization History  Administered Date(s) Administered   Influenza-Unspecified 09/17/2014, 09/25/2017   PFIZER(Purple Top)SARS-COV-2 Vaccination 12/18/2019, 01/08/2020   PPD Test 01/28/2015, 03/28/2017    Physical exam  Vitals:   07/28/24 0044 07/28/24 0440 07/28/24 0850 07/28/24 1514  BP: 112/70 (!) 99/94 110/71 104/67  Pulse: (!) 55 (!) 50 (!) 50 (!) 56  Resp: 17 17 16 16   Temp: 98.4 F (36.9 C) 97.9 F (36.6 C) 97.6 F (36.4 C) 98.2 F (36.8 C)  TempSrc: Oral Oral  Oral Oral  SpO2:    100%  Weight:      Height:       General: alert, cooperative, and no distress Lochia: appropriate Uterine Fundus: firm Incision: N/A DVT Evaluation: No evidence of DVT seen on physical exam. Labs: Lab Results  Component Value Date   WBC 15.5 (H) 07/28/2024   HGB 10.5 (L) 07/28/2024   HCT 30.2 (L) 07/28/2024   MCV 89.3 07/28/2024   PLT 189 07/28/2024      Latest Ref Rng & Units 04/28/2023    4:51 AM  CMP  Glucose 70 - 99 mg/dL 894   BUN 6 - 20 mg/dL 13   Creatinine 9.55 - 1.00 mg/dL 8.98   Sodium 864 - 854 mmol/L 136   Potassium 3.5 - 5.1 mmol/L 3.9   Chloride 98 - 111 mmol/L 106   CO2 22 - 32 mmol/L 23   Calcium 8.9 - 10.3 mg/dL 8.5   Total Protein 6.5 - 8.1 g/dL 7.1   Total Bilirubin 0.3 - 1.2 mg/dL 0.6   Alkaline Phos 38 - 126 U/L 42   AST 15 - 41 U/L 21   ALT 0 - 44 U/L 19    Edinburgh Score:    07/28/2024    1:42 PM  Edinburgh Postnatal Depression Scale Screening Tool  I have been able to laugh and see the funny side of things. 0  I have looked forward with enjoyment to things. 0  I have blamed myself unnecessarily when things went wrong. 0  I have been anxious or worried  for no good reason. 0  I have felt scared or panicky for no good reason. 0  Things have been getting on top of me. 0  I have been so unhappy that I have had difficulty sleeping. 0  I have felt sad or miserable. 0  I have been so unhappy that I have been crying. 0  The thought of harming myself has occurred to me. 0  Edinburgh Postnatal Depression Scale Total 0      After visit meds:  Allergies as of 07/28/2024   No Known Allergies      Medication List     STOP taking these medications    amoxicillin  500 MG capsule Commonly known as: AMOXIL    B-D 3CC LUER-LOK SYR 18GX1-1/2 18G X 1-1/2 3 ML Misc Generic drug: SYRINGE-NEEDLE (DISP) 3 ML   citalopram  10 MG tablet Commonly known as: CELEXA    doxycycline  100 MG capsule Commonly known as: VIBRAMYCIN    Easy  Touch Hypodermic Needle 22G X 1-1/2 Misc Generic drug: NEEDLE (DISP) 22 G   estradiol  0.1 MG/24HR patch Commonly known as: Vivelle -Dot   estradiol  2 MG tablet Commonly known as: Estrace    medroxyPROGESTERone  10 MG tablet Commonly known as: Provera    methylPREDNISolone  4 MG tablet Commonly known as: Medrol    ondansetron  4 MG disintegrating tablet Commonly known as: ZOFRAN -ODT   progesterone  200 MG capsule Commonly known as: Prometrium    progesterone  50 MG/ML injection       TAKE these medications    ibuprofen  600 MG tablet Commonly known as: ADVIL  Take 1 tablet (600 mg total) by mouth every 6 (six) hours as needed for mild pain (1-3). What changed: Another medication with the same name was added. Make sure you understand how and when to take each.   ibuprofen  600 MG tablet Commonly known as: ADVIL  Take 1 tablet (600 mg total) by mouth every 6 (six) hours as needed for moderate pain (pain score 4-6) or cramping. What changed: You were already taking a medication with the same name, and this prescription was added. Make sure you understand how and when to take each.   levothyroxine  50 MCG tablet Commonly known as: Synthroid  Take 1 tablet by mouth in the morning on an empty stomach What changed: Another medication with the same name was removed. Continue taking this medication, and follow the directions you see here.   sertraline  50 MG tablet Commonly known as: ZOLOFT  Take 1 tablet (50 mg total) by mouth daily.         Discharge home in stable condition Infant Feeding: Breast Infant Disposition:home with mother Discharge instruction: per After Visit Summary and Postpartum booklet. Activity: Advance as tolerated. Pelvic rest for 6 weeks.  Diet: routine diet Anticipated Birth Control: same sex couple, IVF pregnancy Postpartum Appointment:6 weeks Additional Postpartum F/U: Postpartum Depression checkup Future Appointments:No future appointments. Follow up  Visit:  Follow-up Information     [facility]. Schedule an appointment as soon as possible for a visit.   Why: 6 weeks for postpartum visit Contact information: [address]  [address] [place] [address] [phone]                     07/28/2024 Ted LELON Solo, DO

## 2024-07-28 NOTE — Anesthesia Postprocedure Evaluation (Signed)
 Anesthesia Post Note  Patient: Location manager  Procedure(s) Performed: AN AD HOC LABOR EPIDURAL     Anesthesia Type: Epidural Anesthetic complications: no   No notable events documented.  Last Vitals:  Vitals:   07/28/24 0440 07/28/24 0850  BP: (!) 99/94 110/71  Pulse: (!) 50 (!) 50  Resp: 17 16  Temp: 36.6 C 36.4 C  SpO2:      Last Pain:  Vitals:   07/28/24 0850  TempSrc: Oral  PainSc: 2    Pain Goal:                   Melanie Keller

## 2024-07-29 ENCOUNTER — Other Ambulatory Visit: Payer: Self-pay

## 2024-07-29 ENCOUNTER — Other Ambulatory Visit (HOSPITAL_COMMUNITY): Payer: Self-pay

## 2024-08-08 ENCOUNTER — Other Ambulatory Visit (HOSPITAL_COMMUNITY): Payer: Self-pay

## 2024-08-10 ENCOUNTER — Telehealth (HOSPITAL_COMMUNITY): Payer: Self-pay | Admitting: *Deleted

## 2024-08-10 NOTE — Telephone Encounter (Signed)
 Attempted hospital discharge follow-up call. Left message for patient to return RN call with any questions or concerns. Allean IVAR Carton, RN, 08/10/24, [phone]

## 2024-08-14 ENCOUNTER — Other Ambulatory Visit: Payer: Self-pay | Admitting: Obstetrics and Gynecology

## 2024-08-14 DIAGNOSIS — N63 Unspecified lump in unspecified breast: Secondary | ICD-10-CM

## 2024-08-19 ENCOUNTER — Ambulatory Visit
Admission: RE | Admit: 2024-08-19 | Discharge: 2024-08-19 | Disposition: A | Discharge status: Home / Self Care | Source: Ambulatory Visit | Attending: Obstetrics and Gynecology | Admitting: Obstetrics and Gynecology

## 2024-08-19 ENCOUNTER — Other Ambulatory Visit: Payer: Self-pay | Admitting: Obstetrics and Gynecology

## 2024-08-19 DIAGNOSIS — N63 Unspecified lump in unspecified breast: Secondary | ICD-10-CM

## 2024-08-19 DIAGNOSIS — N6310 Unspecified lump in the right breast, unspecified quadrant: Secondary | ICD-10-CM

## 2024-08-23 ENCOUNTER — Ambulatory Visit
Admission: RE | Admit: 2024-08-23 | Discharge: 2024-08-23 | Disposition: A | Discharge status: Home / Self Care | Source: Ambulatory Visit | Attending: Obstetrics and Gynecology | Admitting: Obstetrics and Gynecology

## 2024-08-23 DIAGNOSIS — N6310 Unspecified lump in the right breast, unspecified quadrant: Secondary | ICD-10-CM

## 2024-08-23 HISTORY — PX: BREAST BIOPSY: SHX20

## 2024-08-27 LAB — SURGICAL PATHOLOGY

## 2025-01-02 ENCOUNTER — Other Ambulatory Visit (HOSPITAL_COMMUNITY): Payer: Self-pay

## 2025-01-02 MED ORDER — FLUOROURACIL 5 % EX CREA
TOPICAL_CREAM | Freq: Every day | CUTANEOUS | 0 refills | Status: AC
  Filled 2025-01-02: qty 40, 30d supply, fill #0

## 2025-01-14 ENCOUNTER — Other Ambulatory Visit (HOSPITAL_COMMUNITY): Payer: Self-pay
